# Patient Record
Sex: Female | Born: 1970 | Race: White | Hispanic: No | Marital: Married | Smoking: Never smoker
Health system: Southern US, Community
[De-identification: ages and names within clinical notes are randomized; demographics above are authoritative.]

## PROBLEM LIST (undated history)

## (undated) DIAGNOSIS — J45909 Unspecified asthma, uncomplicated: Secondary | ICD-10-CM

## (undated) DIAGNOSIS — Q6589 Other specified congenital deformities of hip: Secondary | ICD-10-CM

## (undated) DIAGNOSIS — F32A Depression, unspecified: Secondary | ICD-10-CM

## (undated) DIAGNOSIS — F419 Anxiety disorder, unspecified: Secondary | ICD-10-CM

## (undated) DIAGNOSIS — F909 Attention-deficit hyperactivity disorder, unspecified type: Secondary | ICD-10-CM

## (undated) DIAGNOSIS — F329 Major depressive disorder, single episode, unspecified: Secondary | ICD-10-CM

## (undated) DIAGNOSIS — N939 Abnormal uterine and vaginal bleeding, unspecified: Secondary | ICD-10-CM

## (undated) DIAGNOSIS — U071 COVID-19: Secondary | ICD-10-CM

## (undated) DIAGNOSIS — L409 Psoriasis, unspecified: Secondary | ICD-10-CM

## (undated) DIAGNOSIS — N949 Unspecified condition associated with female genital organs and menstrual cycle: Secondary | ICD-10-CM

## (undated) DIAGNOSIS — Z6791 Unspecified blood type, Rh negative: Secondary | ICD-10-CM

## (undated) DIAGNOSIS — O169 Unspecified maternal hypertension, unspecified trimester: Secondary | ICD-10-CM

## (undated) DIAGNOSIS — E785 Hyperlipidemia, unspecified: Secondary | ICD-10-CM

## (undated) DIAGNOSIS — A630 Anogenital (venereal) warts: Secondary | ICD-10-CM

## (undated) HISTORY — PX: TONSILLECTOMY: SUR1361

## (undated) HISTORY — DX: Psoriasis, unspecified: L40.9

## (undated) HISTORY — DX: Other specified congenital deformities of hip: Q65.89

## (undated) HISTORY — DX: Unspecified blood type, rh negative: Z67.91

## (undated) HISTORY — DX: COVID-19: U07.1

## (undated) HISTORY — DX: Unspecified maternal hypertension, unspecified trimester: O16.9

## (undated) HISTORY — DX: Anogenital (venereal) warts: A63.0

---
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
## 1998-03-28 ENCOUNTER — Other Ambulatory Visit: Admission: RE | Admit: 1998-03-28 | Discharge: 1998-03-28 | Payer: Self-pay | Admitting: Obstetrics and Gynecology

## 1998-09-12 ENCOUNTER — Inpatient Hospital Stay (HOSPITAL_COMMUNITY): Admission: AD | Admit: 1998-09-12 | Discharge: 1998-09-12 | Payer: Self-pay | Admitting: Obstetrics and Gynecology

## 1998-11-09 ENCOUNTER — Inpatient Hospital Stay (HOSPITAL_COMMUNITY): Admission: AD | Admit: 1998-11-09 | Discharge: 1998-11-12 | Payer: Self-pay | Admitting: Obstetrics and Gynecology

## 1999-04-24 ENCOUNTER — Other Ambulatory Visit: Admission: RE | Admit: 1999-04-24 | Discharge: 1999-04-24 | Payer: Self-pay | Admitting: Obstetrics and Gynecology

## 2000-07-15 ENCOUNTER — Other Ambulatory Visit: Admission: RE | Admit: 2000-07-15 | Discharge: 2000-07-15 | Payer: Self-pay | Admitting: Obstetrics and Gynecology

## 2001-05-14 ENCOUNTER — Other Ambulatory Visit: Admission: RE | Admit: 2001-05-14 | Discharge: 2001-05-14 | Payer: Self-pay | Admitting: Obstetrics and Gynecology

## 2002-06-01 ENCOUNTER — Other Ambulatory Visit: Admission: RE | Admit: 2002-06-01 | Discharge: 2002-06-01 | Payer: Self-pay | Admitting: Obstetrics and Gynecology

## 2003-03-02 ENCOUNTER — Inpatient Hospital Stay (HOSPITAL_COMMUNITY): Admission: AD | Admit: 2003-03-02 | Discharge: 2003-03-02 | Payer: Self-pay | Admitting: Obstetrics and Gynecology

## 2003-07-08 ENCOUNTER — Inpatient Hospital Stay (HOSPITAL_COMMUNITY): Admission: AD | Admit: 2003-07-08 | Discharge: 2003-07-08 | Payer: Self-pay | Admitting: Obstetrics and Gynecology

## 2003-08-27 ENCOUNTER — Inpatient Hospital Stay (HOSPITAL_COMMUNITY): Admission: AD | Admit: 2003-08-27 | Discharge: 2003-08-28 | Payer: Self-pay | Admitting: Obstetrics and Gynecology

## 2003-09-01 ENCOUNTER — Inpatient Hospital Stay (HOSPITAL_COMMUNITY): Admission: AD | Admit: 2003-09-01 | Discharge: 2003-09-03 | Payer: Self-pay | Admitting: Obstetrics and Gynecology

## 2003-09-04 ENCOUNTER — Inpatient Hospital Stay (HOSPITAL_COMMUNITY): Admission: AD | Admit: 2003-09-04 | Discharge: 2003-09-07 | Payer: Self-pay | Admitting: Obstetrics and Gynecology

## 2003-10-14 ENCOUNTER — Other Ambulatory Visit: Admission: RE | Admit: 2003-10-14 | Discharge: 2003-10-14 | Payer: Self-pay | Admitting: Obstetrics and Gynecology

## 2004-10-16 ENCOUNTER — Other Ambulatory Visit: Admission: RE | Admit: 2004-10-16 | Discharge: 2004-10-16 | Payer: Self-pay | Admitting: Obstetrics and Gynecology

## 2004-10-17 ENCOUNTER — Encounter (INDEPENDENT_AMBULATORY_CARE_PROVIDER_SITE_OTHER): Payer: Self-pay | Admitting: Specialist

## 2004-10-17 ENCOUNTER — Encounter: Admission: RE | Admit: 2004-10-17 | Discharge: 2004-10-17 | Payer: Self-pay | Admitting: Obstetrics and Gynecology

## 2004-10-17 HISTORY — PX: BREAST BIOPSY: SHX20

## 2005-04-02 ENCOUNTER — Encounter: Admission: RE | Admit: 2005-04-02 | Discharge: 2005-04-02 | Payer: Self-pay | Admitting: Obstetrics and Gynecology

## 2005-11-12 ENCOUNTER — Other Ambulatory Visit: Admission: RE | Admit: 2005-11-12 | Discharge: 2005-11-12 | Payer: Self-pay | Admitting: Obstetrics and Gynecology

## 2010-10-13 NOTE — Discharge Summary (Signed)
NAME:  MACKIE, GOON                        ACCOUNT NO.:  [id number]   MEDICAL RECORD NO.:  [id number]                   PATIENT TYPE:  INP   LOCATION:  9306                                 FACILITY:  [facility]   PHYSICIAN:  Osborn Coho, M.D.                DATE OF BIRTH:  [birth year]   DATE OF ADMISSION:  09/04/2003  DATE OF DISCHARGE:  09/07/2003                                 DISCHARGE SUMMARY   ADMITTING DIAGNOSES:  1. Three days status post spontaneous vaginal birth.  2. Febrile illness.  3. Engorgement.  4. Elevated white blood cell count.   DISCHARGE DIAGNOSIS:  Postpartum fever with resolution.   PROCEDURES:  1. Antibiotic therapy.  2. Pelvic ultrasound.   HOSPITAL COURSE:  Ms. Robbs is a 40 year old gravida 2 para 2-0-0-2 at 3  days status post spontaneous vaginal birth who presented on September 04, 2003  with a 24-hour history of fever (maximum of 102.8 on day of admission, 101  the day prior to).  She was also having some significant constipation and  engorgement.  She denied any dysuria, nausea, vomiting, cramping, or heavy  bleeding.  She has had chills but overall felt well except for engorgement.  She had stopped breastfeeding approximately 2 days ago and was using the  usual comfort measures.  She had had an uncomplicated vaginal birth with a  negative beta strep culture.  She had a second degree perineal laceration  and epidural anesthesia.  Her history was remarkable for (1) chronic  psoriasis, (2) decreased platelets with platelets 157 at new OB visit, 102  on labor and delivery admission, 90 on day #1 postpartum with Dr. Pennie Rushing  planning a 6-week followup, (3) history of PIH with her previous  pregnancies.   On admission patient's temperatures were 100.6 and 100.3, pulse was 133,  other vital signs were stable, her chest was clear, she also had significant  bilateral engorgement but no evidence of mastitis, she had positive bowel  sounds and had mild  tenderness in the left lower quadrant, uterus was well  involuted, fundus was normal and firm, negative CVA tenderness, perineum was  within normal limits for second degree healing laceration, there was no  evidence of DVT.   Labs showed a white blood cell count of 15.8 with a shift to the left, UA  showed moderate blood, 15 ketones, and 100 mg of protein (this was sent to  culture).   Patient was admitted for IV antibiotics and continued observation.  By the  morning she had had a bowel movement after Dulcolax, she still had some left  lower quadrant pain but was improved, her temperature max was 103.3 at 2  a.m., blood pressures were stable, fundus was nontender; white blood cell  count was down to 13.8, platelets were 139, hemoglobin was 11.6, neutrophils  were 89, ultrasound was done to rule out retained products, it showed no  significant  findings; she did have a temperature max of 102.3 at 2 a.m. on  September 06, 2003 but that was the last elevation of her temperature she had,  her breasts were remaining full but were less engorged, she was beginning to  feel better.  By postpartum day #6/hospitalization day #3 she had been  afebrile for over 24 hours, breasts were less engorged, her left lower  quadrant abdominal pain was essentially resolved, fundus was firm, perineum  was healing, lochia was scant, she was seen by Dr. Su Hilt and was deemed to  have received full benefit of her hospital stay and was discharged home.   DISCHARGE INSTRUCTIONS:  Patient is to continue her postpartum recovery care  as usual.  She is to notify with any temperature elevations of greater than  100.4 despite usual measures.  Discharge followup will occur in 6 weeks  [facility].     Renaldo Reel Emilee Hero, C.N.M.                   Osborn Coho, M.D.    VLL/MEDQ  D:  09/07/2003  T:  09/07/2003  Job:  [id number]

## 2010-10-13 NOTE — H&P (Signed)
NAME:  Brandi Hill, Brandi Hill                        ACCOUNT NO.:  [id number]   MEDICAL RECORD NO.:  [id number]                   PATIENT TYPE:  INP   LOCATION:  9170                                 FACILITY:  [facility]   PHYSICIAN:  Osborn Coho, M.D.                DATE OF BIRTH:  09-05-1970   DATE OF ADMISSION:  09/01/2003  DATE OF DISCHARGE:                                HISTORY & PHYSICAL   This is a 40 year old gravida 2, para 1-0-0-1, at 38-4/7 weeks, who presents  with uterine contractions every four to five minutes and leaking fluid upon  arrival.  She reports back labor and is very uncomfortable now.  Her  pregnancy has been followed by Dr. Pennie Rushing and remarkable for:   1. Rh negative.  2. History of PIH.  3. Psoriasis.  4. HPV.  5. History of congenital hip dysplasia.  6. Family history of sarcoidosis.  7. 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    ALLERGIES:  None.   PAST OBSTETRICAL HISTORY:  Remarkable for vaginal birth in 2000 of a female  infant at 54 weeks' gestation weighing 6 pounds 1 ounce.  Remarkable for  induction of labor due to pregnancy-induced hypertension.   PAST MEDICAL HISTORY:  Remarkable for pregnancy-induced hypertension in  2000.  Rh negative.  History of HPV.  History of yeast infections.  Childhood varicella.  Psoriasis.  History of congenital hip dysplasia.   FAMILY HISTORY:  Remarkable for MI in her grandfather and uncle,  hypertension in her father, diabetes in her grandmother, colon cancer in her  grandfather, depression in her mother and aunt.   PAST SURGICAL HISTORY:  Remarkable for tonsillectomy at age 75.   GENETIC HISTORY:  Remarkable for patient with congenital hip dysplasia.   SOCIAL HISTORY:  The patient is married to [employer], who is involved  and supportive.  She works as a Social worker.  She does not report a  religious affiliation.  She denies any alcohol, tobacco, or drug use.   OBJECTIVE:  VITAL SIGNS:  Stable, afebrile.  HEENT:   Within normal limits.  NECK:  Thyroid normal, not enlarged.  CHEST:  Clear to auscultation.  CARDIAC:  Regular rate and rhythm.  ABDOMEN:  Gravid at 38 cm, vertex to Leopold's.  EFM shows a reactive fetal  heart rate with uterine contractions every two to four minutes.  PELVIC:  Cervical exam is 4-5 cm, 95% effaced, and 0 station with positive  clear fluid.  EXTREMITIES:  Within normal limits.  SKIN:  Remarkable for scattered lesions of psoriasis throughout.   PRENATAL LABORATORY DATA:  Hemoglobin 13.2, platelets 157.  Blood type O  negative.  Antibody screen negative.  RPR nonreactive.  Rubella immune.  Hepatitis negative.  Pap normal.  Gonorrhea negative, Chlamydia negative.  Cystic fibrosis negative.   ASSESSMENT:  1. Intrauterine pregnancy at 38-4/7 weeks.  2. Active labor.  3. Rupture of membranes.  PLAN:  1. Consulted Dr. Su Hilt, admit to birthing suites.  2. Routine M.D. orders.  3. Nubain and epidural for analgesia.  4. Anticipate SVD.     Marie L. Williams, C.N.M.                 Osborn Coho, M.D.    MLW/MEDQ  D:  09/01/2003  T:  09/01/2003  Job:  [id number]

## 2010-10-13 NOTE — H&P (Signed)
NAME:  Brandi Hill, Brandi Hill                        ACCOUNT NO.:  [id number]   MEDICAL RECORD NO.:  [id number]                   PATIENT TYPE:  INP   LOCATION:  9306                                 [facility]   PHYSICIAN:  Brandi Hill, M.D.              DATE OF BIRTH:  Mar 05, 1971   DATE OF ADMISSION:  09/04/2003  DATE OF DISCHARGE:                                HISTORY & PHYSICAL   BRIEF HISTORY:  Brandi Hill is a 40 year old gravida 2, para 2-0-0-2 at 3  days status post spontaneous vaginal birth with a 24-hour history of fever,  temperature had reached a maximum of 102.8 on the day of admission (it had  been 101 yesterday), she also reported constipation and left lower quadrant  pain.  She has had no bowel movement since prior to delivery.  She denied  dysuria, nausea, vomiting, cramping, or heavy bleeding.  She has had chills  but overall feels well except for engorgement.  She did stop breastfeeding  approximately 2 days ago and has been using the usual comfort measures.  Her  history was remarkable for an uncomplicated vaginal birth, negative beta  strep, secondary perineal laceration and an epidural anesthesia.  Her  medical history has been remarkable for (1) chronic psoriasis, (2) decreased  platelets with platelets of 157 at her new OB, 102 on labor and delivery  admission and 90 on day #1 postpartum, plan per Dr. Pennie Rushing is to repeat at  6 weeks, (3) history of PIH with previous pregnancies.  Labs today showed  CBC of 12.1, hematocrit of 35.4, platelet count of 124, and white blood cell  count of 15.8, differential showed neutrophils of 91, cath UA showed  specific gravity of 1.015, moderate hemoglobin, 15 ketones and 100 mg of  protein, this was sent to culture, blood type otherwise was Rh negative.   ALLERGIES:  None.   PAST OBSTETRICAL HISTORY:  She had a spontaneous vaginal birth on September 02, 2003 with delivery of a viable female.  She had a second degree  perineal  laceration, negative beta strep and an epidural.  Other obstetrical history  includes a vaginal birth in 2000 of a female infant at [redacted] weeks gestation  weighing 6 pounds 1 ounce, she did have induction of labor in that pregnancy  due to pregnancy-induced hypertension.   MEDICAL HISTORY:  Medical history was remarkable for being Rh negative, a  history of HPV, she has a history of yeast infections, childhood Varicella,  chronic psoriasis, and history of congenital hip dysplasia.   FAMILY HISTORY:  Her grandfather had an MI as well as her uncle.  Her father  has hypertension.  Her grandmother has diabetes.  Her grandfather has colon  cancer.  Her mother and aunt have depression.   SURGICAL HISTORY:  Tonsillectomy at age 28.   OTHER HOSPITALIZATIONS:  Childbirth x2.   GENETIC HISTORY:  Patient with congenital  hip dysplasia.   SOCIAL HISTORY:  The patient is married to [place] [employer] who is involved  and supportive.  Patient works as a Scientist, research (medical).  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  She does  not report any religious affiliation.  She denies any alcohol, tobacco, or  drug use.   PHYSICAL EXAMINATION:  Temperature is 100.6 and 100.3, pulse is 133, blood  pressure is 106/64.  Chest is clear.  Heart regular rate and rhythm without  murmur.  Breasts are full and engorged but no evidence of mastitis is noted.  CABG leads are in place.  Abdomen shows positive upper abdominal tympany  with positive bowel sounds noted.  There is no rebound or guarding.  There  is mild tenderness in the left lower quadrant.  Uterus is well involuted and  nontender with no cervical motion tenderness.  Fundus is approximately 4  below the umbilicus, nontender, and firm.  There is negative CVA tenderness  noted.  Extremities are within normal limits, there is no significant edema  and Homan sign is negative.  Perineum shows a healing second degree   laceration, nontender, slightly erythematous but no cellulitis.   ASSESSMENT:  1. Three days status post spontaneous vaginal birth.  2. Febrile illness.  3. Engorgement.  4. Elevated white blood cell count.   PLAN:  1. Admitted to the [facility] third floor per consult     with Dr. Jaymes Graff as attending physician.  2. IV Unasyn 3 g q.6h.  3. Repeat CBC in the morning with differential.  4. If patient does not defervesce by the morning planned ultrasound to rule     out retained products.  5. Engorgement comfort measures.  6. Dulcolax suppository and Colace.  7. MDs will follow.     Brandi Hill, C.N.M.                   Brandi Hill, M.D.    Brandi Hill  D:  09/07/2003  T:  09/07/2003  Job:  [id number]

## 2011-03-01 ENCOUNTER — Other Ambulatory Visit: Payer: Self-pay | Admitting: Obstetrics and Gynecology

## 2011-03-01 DIAGNOSIS — Z1231 Encounter for screening mammogram for malignant neoplasm of breast: Secondary | ICD-10-CM

## 2011-04-23 ENCOUNTER — Ambulatory Visit
Admission: RE | Admit: 2011-04-23 | Discharge: 2011-04-23 | Disposition: A | Discharge status: Home / Self Care | Payer: BC Managed Care – PPO | Source: Ambulatory Visit | Attending: Obstetrics and Gynecology | Admitting: Obstetrics and Gynecology

## 2011-04-23 DIAGNOSIS — Z1231 Encounter for screening mammogram for malignant neoplasm of breast: Secondary | ICD-10-CM

## 2011-05-01 ENCOUNTER — Other Ambulatory Visit: Payer: Self-pay | Admitting: Obstetrics and Gynecology

## 2011-05-01 DIAGNOSIS — R928 Other abnormal and inconclusive findings on diagnostic imaging of breast: Secondary | ICD-10-CM

## 2011-05-11 ENCOUNTER — Ambulatory Visit
Admission: RE | Admit: 2011-05-11 | Discharge: 2011-05-11 | Disposition: A | Discharge status: Home / Self Care | Payer: BC Managed Care – PPO | Source: Ambulatory Visit | Attending: Obstetrics and Gynecology | Admitting: Obstetrics and Gynecology

## 2011-05-11 DIAGNOSIS — R928 Other abnormal and inconclusive findings on diagnostic imaging of breast: Secondary | ICD-10-CM

## 2011-10-29 ENCOUNTER — Encounter: Payer: Self-pay | Admitting: Obstetrics and Gynecology

## 2011-10-31 ENCOUNTER — Ambulatory Visit: Payer: Self-pay | Admitting: Obstetrics and Gynecology

## 2012-04-28 ENCOUNTER — Ambulatory Visit: Payer: Self-pay | Admitting: Obstetrics and Gynecology

## 2012-07-14 ENCOUNTER — Ambulatory Visit: Payer: Self-pay | Admitting: Obstetrics and Gynecology

## 2012-08-11 ENCOUNTER — Other Ambulatory Visit: Payer: Self-pay

## 2012-08-11 DIAGNOSIS — Z1231 Encounter for screening mammogram for malignant neoplasm of breast: Secondary | ICD-10-CM

## 2012-09-08 ENCOUNTER — Ambulatory Visit: Payer: BC Managed Care – PPO

## 2012-11-10 ENCOUNTER — Ambulatory Visit: Payer: BC Managed Care – PPO

## 2012-11-17 ENCOUNTER — Ambulatory Visit
Admission: RE | Admit: 2012-11-17 | Discharge: 2012-11-17 | Disposition: A | Discharge status: Home / Self Care | Payer: BC Managed Care – PPO | Source: Ambulatory Visit

## 2012-11-17 DIAGNOSIS — Z1231 Encounter for screening mammogram for malignant neoplasm of breast: Secondary | ICD-10-CM

## 2012-11-18 ENCOUNTER — Encounter: Payer: Self-pay | Admitting: Obstetrics and Gynecology

## 2012-11-18 DIAGNOSIS — R922 Inconclusive mammogram: Secondary | ICD-10-CM | POA: Insufficient documentation

## 2013-11-23 ENCOUNTER — Other Ambulatory Visit: Payer: Self-pay

## 2013-11-23 DIAGNOSIS — Z1231 Encounter for screening mammogram for malignant neoplasm of breast: Secondary | ICD-10-CM

## 2014-01-25 ENCOUNTER — Ambulatory Visit: Payer: BC Managed Care – PPO

## 2014-02-22 ENCOUNTER — Ambulatory Visit: Payer: BC Managed Care – PPO

## 2014-03-15 ENCOUNTER — Ambulatory Visit: Payer: BC Managed Care – PPO

## 2014-03-29 ENCOUNTER — Encounter: Payer: Self-pay | Admitting: Obstetrics and Gynecology

## 2014-03-29 ENCOUNTER — Ambulatory Visit
Admission: RE | Admit: 2014-03-29 | Discharge: 2014-03-29 | Disposition: A | Discharge status: Home / Self Care | Payer: BC Managed Care – PPO | Source: Ambulatory Visit

## 2014-03-29 DIAGNOSIS — Z1231 Encounter for screening mammogram for malignant neoplasm of breast: Secondary | ICD-10-CM

## 2015-03-30 ENCOUNTER — Other Ambulatory Visit: Payer: Self-pay

## 2015-03-30 DIAGNOSIS — Z1231 Encounter for screening mammogram for malignant neoplasm of breast: Secondary | ICD-10-CM

## 2015-05-02 ENCOUNTER — Ambulatory Visit: Payer: Self-pay

## 2015-06-06 ENCOUNTER — Inpatient Hospital Stay: Admission: RE | Admit: 2015-06-06 | Payer: Self-pay | Source: Ambulatory Visit

## 2015-06-06 ENCOUNTER — Ambulatory Visit: Payer: Self-pay

## 2015-06-08 ENCOUNTER — Other Ambulatory Visit: Payer: Self-pay | Admitting: Obstetrics and Gynecology

## 2015-06-08 ENCOUNTER — Ambulatory Visit
Admission: RE | Admit: 2015-06-08 | Discharge: 2015-06-08 | Disposition: A | Discharge status: Home / Self Care | Payer: BLUE CROSS/BLUE SHIELD | Source: Ambulatory Visit

## 2015-06-08 DIAGNOSIS — N63 Unspecified lump in unspecified breast: Secondary | ICD-10-CM

## 2015-06-08 DIAGNOSIS — Z1231 Encounter for screening mammogram for malignant neoplasm of breast: Secondary | ICD-10-CM

## 2015-06-13 ENCOUNTER — Ambulatory Visit
Admission: RE | Admit: 2015-06-13 | Discharge: 2015-06-13 | Disposition: A | Discharge status: Home / Self Care | Payer: BLUE CROSS/BLUE SHIELD | Source: Ambulatory Visit | Attending: Obstetrics and Gynecology | Admitting: Obstetrics and Gynecology

## 2015-06-13 DIAGNOSIS — N63 Unspecified lump in unspecified breast: Secondary | ICD-10-CM

## 2016-06-27 ENCOUNTER — Other Ambulatory Visit: Payer: Self-pay | Admitting: Obstetrics and Gynecology

## 2016-06-27 DIAGNOSIS — Z1231 Encounter for screening mammogram for malignant neoplasm of breast: Secondary | ICD-10-CM

## 2016-07-30 ENCOUNTER — Ambulatory Visit
Admission: RE | Admit: 2016-07-30 | Discharge: 2016-07-30 | Disposition: A | Discharge status: Home / Self Care | Payer: PRIVATE HEALTH INSURANCE | Source: Ambulatory Visit | Attending: Obstetrics and Gynecology | Admitting: Obstetrics and Gynecology

## 2016-07-30 DIAGNOSIS — Z1231 Encounter for screening mammogram for malignant neoplasm of breast: Secondary | ICD-10-CM

## 2017-08-28 ENCOUNTER — Other Ambulatory Visit: Payer: Self-pay | Admitting: Obstetrics and Gynecology

## 2017-08-28 DIAGNOSIS — Z1231 Encounter for screening mammogram for malignant neoplasm of breast: Secondary | ICD-10-CM

## 2017-09-25 ENCOUNTER — Other Ambulatory Visit: Payer: Self-pay | Admitting: Obstetrics and Gynecology

## 2017-09-30 ENCOUNTER — Ambulatory Visit
Admission: RE | Admit: 2017-09-30 | Discharge: 2017-09-30 | Disposition: A | Discharge status: Home / Self Care | Payer: PRIVATE HEALTH INSURANCE | Source: Ambulatory Visit | Attending: Obstetrics and Gynecology | Admitting: Obstetrics and Gynecology

## 2017-09-30 DIAGNOSIS — Z1231 Encounter for screening mammogram for malignant neoplasm of breast: Secondary | ICD-10-CM

## 2019-09-22 ENCOUNTER — Other Ambulatory Visit: Payer: Self-pay | Admitting: Obstetrics and Gynecology

## 2019-09-22 DIAGNOSIS — R140 Abdominal distension (gaseous): Secondary | ICD-10-CM

## 2019-09-22 DIAGNOSIS — R1909 Other intra-abdominal and pelvic swelling, mass and lump: Secondary | ICD-10-CM

## 2019-09-22 DIAGNOSIS — R971 Elevated cancer antigen 125 [CA 125]: Secondary | ICD-10-CM

## 2019-09-24 ENCOUNTER — Telehealth: Payer: Self-pay | Admitting: *Deleted

## 2019-09-24 NOTE — Telephone Encounter (Signed)
Called and left the patient a message to call the office. Patient needs a new patient appt

## 2019-09-25 ENCOUNTER — Telehealth: Payer: Self-pay | Admitting: *Deleted

## 2019-09-25 NOTE — Telephone Encounter (Signed)
Called and scheduled the patient for a new pt appt with Dr Denman George on 5/7. Gave the patient the address and phone number of the clinic. Patient given the policy for parking, visitors and masks

## 2019-09-28 ENCOUNTER — Ambulatory Visit
Admission: RE | Admit: 2019-09-28 | Discharge: 2019-09-28 | Disposition: A | Discharge status: Home / Self Care | Payer: PRIVATE HEALTH INSURANCE | Source: Ambulatory Visit | Attending: Obstetrics and Gynecology | Admitting: Obstetrics and Gynecology

## 2019-09-28 DIAGNOSIS — R1909 Other intra-abdominal and pelvic swelling, mass and lump: Secondary | ICD-10-CM

## 2019-09-28 DIAGNOSIS — R140 Abdominal distension (gaseous): Secondary | ICD-10-CM

## 2019-09-28 DIAGNOSIS — R971 Elevated cancer antigen 125 [CA 125]: Secondary | ICD-10-CM

## 2019-09-28 MED ORDER — IOPAMIDOL (ISOVUE-300) INJECTION 61%
100.0000 mL | Freq: Once | INTRAVENOUS | Status: AC | PRN
  Administered 2019-09-28: 100 mL via INTRAVENOUS

## 2019-10-02 ENCOUNTER — Inpatient Hospital Stay: Payer: No Typology Code available for payment source | Attending: Gynecologic Oncology | Admitting: Gynecologic Oncology

## 2019-10-02 ENCOUNTER — Other Ambulatory Visit: Payer: Self-pay

## 2019-10-02 ENCOUNTER — Encounter: Payer: Self-pay | Admitting: Gynecologic Oncology

## 2019-10-02 VITALS — BP 145/93 | HR 73 | Temp 99.1°F | Resp 18 | Ht 65.5 in | Wt 153.4 lb

## 2019-10-02 DIAGNOSIS — N83202 Unspecified ovarian cyst, left side: Secondary | ICD-10-CM | POA: Diagnosis not present

## 2019-10-02 DIAGNOSIS — N939 Abnormal uterine and vaginal bleeding, unspecified: Secondary | ICD-10-CM | POA: Insufficient documentation

## 2019-10-02 DIAGNOSIS — R140 Abdominal distension (gaseous): Secondary | ICD-10-CM | POA: Diagnosis not present

## 2019-10-02 DIAGNOSIS — A630 Anogenital (venereal) warts: Secondary | ICD-10-CM | POA: Insufficient documentation

## 2019-10-02 DIAGNOSIS — R971 Elevated cancer antigen 125 [CA 125]: Secondary | ICD-10-CM | POA: Diagnosis not present

## 2019-10-02 DIAGNOSIS — N83201 Unspecified ovarian cyst, right side: Secondary | ICD-10-CM | POA: Diagnosis not present

## 2019-10-02 NOTE — Patient Instructions (Addendum)
Check with your dermatologist regarding continuing on Stelara through surgery.                 Preparing for your Surgery  Plan for surgery on May 20 with Dr.Jayleah Garbers Denman George at [place] will be scheduled for a Robotic assisted Hysterectomy, Unilateral Salpingo-oophorectomy, and Ovarian Cystectomy.  You are scheduled with Joylene John, NP on Oct 08, 2019 @ 1000 for a pre operative visit.  Pre-operative Testing -You will receive a phone call from presurgical testing at [facility] to arrange for a pre-operative testing appointment before your surgery.  This appointment normally occurs one to two weeks before your scheduled surgery.   -Bring your insurance card, copy of an advanced directive if applicable, medication list  -At that visit, you will be asked to sign a consent for a possible blood transfusion in case a transfusion becomes necessary during surgery.  The need for a blood transfusion is rare but having consent is a necessary part of your care.     -You should not be taking blood thinners or aspirin at least ten days prior to surgery unless instructed by your surgeon.  Day Before Surgery at [place] will be asked to take in a light diet the day before surgery.  Avoid carbonated beverages.  You will be advised to have nothing to eat or drink after midnight the evening before.    Eat a light diet the day before surgery.  Examples including soups, broths, toast, yogurt, mashed potatoes.  Things to avoid include carbonated beverages (fizzy beverages), raw fruits and raw vegetables, or beans.   If your bowels are filled with gas, your surgeon will have difficulty visualizing your pelvic organs which increases your surgical risks.  Your role in recovery Your role is to become active as soon as directed by your doctor, while still giving yourself time to heal.  Rest when you feel tired. You will be asked to do the following in order to speed your recovery:  - Cough  and breathe deeply. This helps toclear and expand your lungs and can prevent pneumonia.  - Do mild physical activity. Walking or moving your legs help your circulation and body functions return to normal. A staff member will help you when you try to walk and will provide you with simple exercises. Do not try to get up or walk alone the first time. - Actively manage your pain. Managing your pain lets you move in comfort. We will ask you to rate your pain on a scale of zero to 10. It is your responsibility to tell your doctor or nurse where and how much you hurt so your pain can be treated.  Special Considerations -If you are diabetic, you may be placed on insulin after surgery to have closer control over your blood sugars to promote healing and recovery.  This does not mean that you will be discharged on insulin.  If applicable, your oral antidiabetics will be resumed when you are tolerating a solid diet.  -Your final pathology results from surgery should be available around one week after surgery and the results will be relayed to you when available.  -Dr.Brantley Naser Denman George  is the Surgeon that assists your GYN Oncologist with surgery.  The next day after your surgery you will either see your GYN Oncologist, Dr. Denman George, or Dr. Lahoma Crocker.  -FMLA forms can be faxed to [phone] and please allow 5-7 business days for completion.  Eat a light diet the  day before surgery.  Examples including soups, broths, toast, yogurt, mashed potatoes.  Things to avoid include carbonated beverages (fizzy beverages), raw fruits and raw vegetables, or beans.   If your bowels are filled with gas, your surgeon will have difficulty visualizing your pelvic organs which increases your surgical risks.  Blood Transfusion Information WHAT IS A BLOOD TRANSFUSION? A transfusion is the replacement of blood or some of its parts. Blood is made up of multiple cells which provide different functions.  Red blood cells carry  oxygen and are used for blood loss replacement.  White blood cells fight against infection.  Platelets control bleeding.  Plasma helps clot blood.  Other blood products are available for specialized needs, such as hemophilia or other clotting disorders. BEFORE THE TRANSFUSION  Who gives blood for transfusions?   You may be able to donate blood to be used at a later date on yourself (autologous donation).  Relatives can be asked to donate blood. This is generally not any safer than if you have received blood from a stranger. The same precautions are taken to ensure safety when a relative's blood is donated.  Healthy volunteers who are fully evaluated to make sure their blood is safe. This is blood bank blood. Transfusion therapy is the safest it has ever been in the practice of medicine. Before blood is taken from a donor, a complete history is taken to make sure that person has no history of diseases nor engages in risky social behavior (examples are intravenous drug use or sexual activity with multiple partners). The donor's travel history is screened to minimize risk of transmitting infections, such as malaria. The donated blood is tested for signs of infectious diseases, such as HIV and hepatitis. The blood is then tested to be sure it is compatible with you in order to minimize the chance of a transfusion reaction. If you or a relative donates blood, this is often done in anticipation of surgery and is not appropriate for emergency situations. It takes many days to process the donated blood. RISKS AND COMPLICATIONS Although transfusion therapy is very safe and saves many lives, the main dangers of transfusion include:   Getting an infectious disease.  Developing a transfusion reaction. This is an allergic reaction to something in the blood you were given. Every precaution is taken to prevent this. The decision to have a blood transfusion has been considered carefully by your caregiver  before blood is given. Blood is not given unless the benefits outweigh the risks.

## 2019-10-02 NOTE — Progress Notes (Signed)
Consult Note: Gyn-Onc  Consult was requested by Dr. Helane Rima for the evaluation of Sabri A Eder 49 y.o. female  CC:  Chief Complaint  Patient presents with  . Elevated CA-125    New Patient    Assessment/Plan:  Ms. Marin Vittone Ruacho  is a 49 y.o.  year old with bilateral adnexal cysts and abnormal uterine bleeding.  She has a mildly elevated Ca1 25 at 64.  I reviewed the CT scan images with the patient and her husband.  I discussed that overall I have a lower suspicion of malignancy given the cystic appearance of the mass and its uniform shape.  I explained that Ca1 25 can be associated with false positives particularly premenopausal women with benign adnexal masses.  However given the elevation in Ca1 25, her symptom profile, I recommend surgical resection for definitive diagnosis.  I offered the patient a robotic assisted total hysterectomy with right salpingo-oophorectomy and left ovarian cystectomy.  If the left ovary appears grossly abnormal or concerning for malignancy, we will resect and remove the entire left tube and ovary.  I explained that frozen section will be obtained and if malignancy is identified staging procedure will be performed that we will involve removal of the contralateral ovary if not already done so, and staging with lymph nodes and peritoneal biopsies omentectomy as indicated by the histopathology.  I explained the operative risks associated with this procedure including  bleeding, infection, damage to internal organs (such as bladder,ureters, bowels), blood clot, reoperation and rehospitalization. I explained anticipated postoperative recovery.  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  I think it is safe and reasonable for her to expect to be able to return to work at 2 weeks postop albeit with somewhat diminished intensity of schedule.  Surgery is scheduled for later in May 2021.   HPI: Ms. Jaynia Dewey is a 49 year old P2 who was  seen in consultation at the request of Dr. Helane Rima for evaluation of a bilateral adnexal masses and elevated Ca1 25.  The patient reported irregular sporadic periods and abdominal bloating since February 2021.  Pelvic examination by her gynecologist felt symptoms were consistent with fibroids.  A transvaginal ultrasound scan was performed on September 21, 2019 and confirmed a uterus measuring 8.8 x 3.4 x 5.4 cm with a 1.15 cm endometrial thickness.  The right ovary was enlarged measuring 8.4 x 5.4 x 7.2 cm and the left ovary was enlarged measuring 3.9 x 3.7 x 3.8 cm.  The right ovary contained an 8.8 x 5.51 cm cyst filled with low-level echoes and a 10 mm solid area on the cyst wall.  It was consistent with either a dermoid or endometrioma.  The left ovary contained multiple small simple appearing cysts with no blood flow seen within the cyst.  There was no free fluid seen.  A Ca1 25 was drawn the same day which was elevated at 64.3.  An endometrial biopsy was also obtained on Sep 25, 2017 in the past and revealed proliferative phase endometrium.  Due to the elevation in Ca1 25 a CT scan of the abdomen and pelvis was ordered and performed on Sep 28, 2019.  It revealed a uterus unremarkable in appearance with an ovoid cystic lesion seen in the right adnexa showing mild peripheral wall thickening measuring 8.9 x 6 cm.  A complex cystic lesion containing multiple thin internal septations was seen in the left adnexa which measured 5.4 x 4 cm.  There is  no evidence of peritoneal thickening, nodularity or ascites.  There was several tiny indeterminate right hepatic lobe lesions.  The patient is otherwise fairly healthy.  She has psoriasis and takes Stelara for this.  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  She is not had her Covid vaccine as she is nervous about side effects and risks of the vaccine.  She has had 2 prior vaginal deliveries and no abdominal surgeries.  She lives with her husband.  Current Meds:   Outpatient Encounter Medications as of 10/02/2019  Medication Sig  . amphetamine-dextroamphetamine (ADDERALL) 10 MG tablet Take 10 mg by mouth daily.  Marland Kitchen amphetamine-dextroamphetamine (ADDERALL) 20 MG tablet dextroamphetamine-amphetamine 20 mg tablet  . buPROPion (WELLBUTRIN SR) 150 MG 12 hr tablet Take 150 mg by mouth 2 (two) times daily.  . Multiple Vitamins-Minerals (MULTIVITAMIN ADULT EXTRA C PO) multivitamin  . STELARA 45 MG/0.5ML SOSY injection INJECT 45 MG UNDER THE SKIN EVERY 12 WEEKS  . [DISCONTINUED] FLUoxetine (PROZAC) 10 MG capsule fluoxetine 10 mg capsule  TAKE ONE CAPSULE BY MOUTH EVERY DAY  . [DISCONTINUED] Magnesium 100 MG CAPS magnesium  daily  . [DISCONTINUED] phentermine (ADIPEX-P) 37.5 MG tablet phentermine 37.5 mg tablet  TK 1 T PO QD  . [DISCONTINUED] progesterone (PROMETRIUM) 100 MG capsule progesterone micronized 100 mg capsule   No facility-administered encounter medications on file as of 10/02/2019.    Allergy:  Allergies  Allergen Reactions  . Bee Venom     Social Hx:   Social History   Socioeconomic History  . Marital status: Married    Spouse name: Not on file  . Number of children: Not on file  . Years of education: Not on file  . Highest education level: Not on file  Occupational History  . Not on file  Tobacco Use  . Smoking status: Never Smoker  . Smokeless tobacco: Never Used  Substance and Sexual Activity  . Alcohol use: Yes    Comment: occasionally  . Drug use: No  . Sexual activity: Yes    Birth control/protection: Surgical    Comment: VAS  Other Topics Concern  . Not on file  Social History Narrative  . Not on file   Social Determinants of Health   Financial Resource Strain:   . Difficulty of Paying Living Expenses:   Food Insecurity:   . Worried About Charity fundraiser in the Last Year:   . Arboriculturist in the Last Year:   Transportation Needs:   . Film/video editor (Medical):   Marland Kitchen Lack of Transportation  (Non-Medical):   Physical Activity:   . Days of Exercise per Week:   . Minutes of Exercise per Session:   Stress:   . Feeling of Stress :   Social Connections:   . Frequency of Communication with Friends and Family:   . Frequency of Social Gatherings with Friends and Family:   . Attends Religious Services:   . Active Member of Clubs or Organizations:   . Attends Archivist Meetings:   Marland Kitchen Marital Status:   Intimate Partner Violence:   . Fear of Current or Ex-Partner:   . Emotionally Abused:   Marland Kitchen Physically Abused:   . Sexually Abused:     Past Surgical Hx:  Past Surgical History:  Procedure Laterality Date  . BREAST BIOPSY  10/17/2004  . TONSILLECTOMY     AGE 31    Past Medical Hx:  Past Medical History:  Diagnosis Date  . Blood  type, Rh negative   . Congenital dysplasia of hip   . HPV (human papilloma virus) anogenital infection    HISTORY OF HPV   . Psoriasis     Past Gynecological History:  See HPi No LMP recorded.  Family Hx:  Family History  Problem Relation Age of Onset  . Depression Mother   . Hypertension Father   . Diabetes Maternal Grandmother   . Cancer Maternal Grandfather        COLON CANCER  . Hypertension Maternal Grandfather   . Psoriasis Paternal Grandmother     Review of Systems:  Constitutional  Feels well,    ENT Normal appearing ears and nares bilaterally Skin/Breast  No rash, sores, jaundice, itching, dryness Cardiovascular  No chest pain, shortness of breath, or edema  Pulmonary  No cough or wheeze.  Gastro Intestinal  No nausea, vomitting, or diarrhoea. No bright red blood per rectum, no abdominal pain, change in bowel movement, or constipation.  Genito Urinary  No frequency, urgency, dysuria, see HPI Musculo Skeletal  No myalgia, arthralgia, joint swelling or pain  Neurologic  No weakness, numbness, change in gait,  Psychology  No depression, anxiety, insomnia.   Vitals:  Blood pressure (!) 145/93, pulse 73,  temperature 99.1 F (37.3 C), temperature source Temporal, resp. rate 18, height 5' 5.5" (1.664 m), weight 153 lb 6.4 oz (69.6 kg), SpO2 100 %.  Physical Exam: WD in NAD Neck  Supple NROM, without any enlargements.  Lymph Node Survey No cervical supraclavicular or inguinal adenopathy Cardiovascular  Pulse normal rate, regularity and rhythm. S1 and S2 normal.  Lungs  Clear to auscultation bilateraly, without wheezes/crackles/rhonchi. Good air movement.  Skin  No rash/lesions/breakdown  Psychiatry  Alert and oriented to person, place, and time  Abdomen  Normoactive bowel sounds, abdomen soft, non-tender and nonobese without evidence of hernia.  Back No CVA tenderness Genito Urinary  Vulva/vagina: Normal external female genitalia.  No lesions. No discharge or bleeding.  Bladder/urethra:  No lesions or masses, well supported bladder  Vagina: smooth, normal, no visible or palpable lesions.  Cervix: Unable to visualize - tilted posteriorally  Uterus: bulky, mobile, no parametrial involvement or nodularity.  Adnexa: fullness in the right adnexa and anteriorally, displacing uterus masses. Rectal  deferred Extremities  No bilateral cyanosis, clubbing or edema.   Thereasa Solo, MD  10/02/2019, 5:02 PM

## 2019-10-02 NOTE — H&P (View-Only) (Signed)
Consult Note: Gyn-Onc  Consult was requested by Dr. Helane Rima for the evaluation of Brandi Hill 49 y.o. female  CC:  Chief Complaint  Patient presents with  . Elevated CA-125    New Patient    Assessment/Plan:  Brandi Hill  is a 49 y.o.  year old with bilateral adnexal cysts and abnormal uterine bleeding.  She has a mildly elevated Ca1 25 at 64.  I reviewed the CT scan images with the patient and her husband.  I discussed that overall I have a lower suspicion of malignancy given the cystic appearance of the mass and its uniform shape.  I explained that Ca1 25 can be associated with false positives particularly premenopausal women with benign adnexal masses.  However given the elevation in Ca1 25, her symptom profile, I recommend surgical resection for definitive diagnosis.  I offered the patient a robotic assisted total hysterectomy with right salpingo-oophorectomy and left ovarian cystectomy.  If the left ovary appears grossly abnormal or concerning for malignancy, we will resect and remove the entire left tube and ovary.  I explained that frozen section will be obtained and if malignancy is identified staging procedure will be performed that we will involve removal of the contralateral ovary if not already done so, and staging with lymph nodes and peritoneal biopsies omentectomy as indicated by the histopathology.  I explained the operative risks associated with this procedure including  bleeding, infection, damage to internal organs (such as bladder,ureters, bowels), blood clot, reoperation and rehospitalization. I explained anticipated postoperative recovery.  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  I think it is safe and reasonable for her to expect to be able to return to work at 2 weeks postop albeit with somewhat diminished intensity of schedule.  Surgery is scheduled for later in May 2021.   HPI: Brandi Hill is a 49 year old P2 who was  seen in consultation at the request of Dr. Helane Rima for evaluation of a bilateral adnexal masses and elevated Ca1 25.  The patient reported irregular sporadic periods and abdominal bloating since February 2021.  Pelvic examination by her gynecologist felt symptoms were consistent with fibroids.  A transvaginal ultrasound scan was performed on September 21, 2019 and confirmed a uterus measuring 8.8 x 3.4 x 5.4 cm with a 1.15 cm endometrial thickness.  The right ovary was enlarged measuring 8.4 x 5.4 x 7.2 cm and the left ovary was enlarged measuring 3.9 x 3.7 x 3.8 cm.  The right ovary contained an 8.8 x 5.51 cm cyst filled with low-level echoes and a 10 mm solid area on the cyst wall.  It was consistent with either a dermoid or endometrioma.  The left ovary contained multiple small simple appearing cysts with no blood flow seen within the cyst.  There was no free fluid seen.  A Ca1 25 was drawn the same day which was elevated at 64.3.  An endometrial biopsy was also obtained on Sep 25, 2017 in the past and revealed proliferative phase endometrium.  Due to the elevation in Ca1 25 a CT scan of the abdomen and pelvis was ordered and performed on Sep 28, 2019.  It revealed a uterus unremarkable in appearance with an ovoid cystic lesion seen in the right adnexa showing mild peripheral wall thickening measuring 8.9 x 6 cm.  A complex cystic lesion containing multiple thin internal septations was seen in the left adnexa which measured 5.4 x 4 cm.  There is  no evidence of peritoneal thickening, nodularity or ascites.  There was several tiny indeterminate right hepatic lobe lesions.  The patient is otherwise fairly healthy.  She has psoriasis and takes Stelara for this.  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  She is not had her Covid vaccine as she is nervous about side effects and risks of the vaccine.  She has had 2 prior vaginal deliveries and no abdominal surgeries.  She lives with her husband.  Current Meds:   Outpatient Encounter Medications as of 10/02/2019  Medication Sig  . amphetamine-dextroamphetamine (ADDERALL) 10 MG tablet Take 10 mg by mouth daily.  Marland Kitchen amphetamine-dextroamphetamine (ADDERALL) 20 MG tablet dextroamphetamine-amphetamine 20 mg tablet  . buPROPion (WELLBUTRIN SR) 150 MG 12 hr tablet Take 150 mg by mouth 2 (two) times daily.  . Multiple Vitamins-Minerals (MULTIVITAMIN ADULT EXTRA C PO) multivitamin  . STELARA 45 MG/0.5ML SOSY injection INJECT 45 MG UNDER THE SKIN EVERY 12 WEEKS  . [DISCONTINUED] FLUoxetine (PROZAC) 10 MG capsule fluoxetine 10 mg capsule  TAKE ONE CAPSULE BY MOUTH EVERY DAY  . [DISCONTINUED] Magnesium 100 MG CAPS magnesium  daily  . [DISCONTINUED] phentermine (ADIPEX-P) 37.5 MG tablet phentermine 37.5 mg tablet  TK 1 T PO QD  . [DISCONTINUED] progesterone (PROMETRIUM) 100 MG capsule progesterone micronized 100 mg capsule   No facility-administered encounter medications on file as of 10/02/2019.    Allergy:  Allergies  Allergen Reactions  . Bee Venom     Social Hx:   Social History   Socioeconomic History  . Marital status: Married    Spouse name: Not on file  . Number of children: Not on file  . Years of education: Not on file  . Highest education level: Not on file  Occupational History  . Not on file  Tobacco Use  . Smoking status: Never Smoker  . Smokeless tobacco: Never Used  Substance and Sexual Activity  . Alcohol use: Yes    Comment: occasionally  . Drug use: No  . Sexual activity: Yes    Birth control/protection: Surgical    Comment: VAS  Other Topics Concern  . Not on file  Social History Narrative  . Not on file   Social Determinants of Health   Financial Resource Strain:   . Difficulty of Paying Living Expenses:   Food Insecurity:   . Worried About Charity fundraiser in the Last Year:   . Arboriculturist in the Last Year:   Transportation Needs:   . Film/video editor (Medical):   Marland Kitchen Lack of Transportation  (Non-Medical):   Physical Activity:   . Days of Exercise per Week:   . Minutes of Exercise per Session:   Stress:   . Feeling of Stress :   Social Connections:   . Frequency of Communication with Friends and Family:   . Frequency of Social Gatherings with Friends and Family:   . Attends Religious Services:   . Active Member of Clubs or Organizations:   . Attends Archivist Meetings:   Marland Kitchen Marital Status:   Intimate Partner Violence:   . Fear of Current or Ex-Partner:   . Emotionally Abused:   Marland Kitchen Physically Abused:   . Sexually Abused:     Past Surgical Hx:  Past Surgical History:  Procedure Laterality Date  . BREAST BIOPSY  10/17/2004  . TONSILLECTOMY     AGE 50    Past Medical Hx:  Past Medical History:  Diagnosis Date  . Blood  type, Rh negative   . Congenital dysplasia of hip   . HPV (human papilloma virus) anogenital infection    HISTORY OF HPV   . Psoriasis     Past Gynecological History:  See HPi No LMP recorded.  Family Hx:  Family History  Problem Relation Age of Onset  . Depression Mother   . Hypertension Father   . Diabetes Maternal Grandmother   . Cancer Maternal Grandfather        COLON CANCER  . Hypertension Maternal Grandfather   . Psoriasis Paternal Grandmother     Review of Systems:  Constitutional  Feels well,    ENT Normal appearing ears and nares bilaterally Skin/Breast  No rash, sores, jaundice, itching, dryness Cardiovascular  No chest pain, shortness of breath, or edema  Pulmonary  No cough or wheeze.  Gastro Intestinal  No nausea, vomitting, or diarrhoea. No bright red blood per rectum, no abdominal pain, change in bowel movement, or constipation.  Genito Urinary  No frequency, urgency, dysuria, see HPI Musculo Skeletal  No myalgia, arthralgia, joint swelling or pain  Neurologic  No weakness, numbness, change in gait,  Psychology  No depression, anxiety, insomnia.   Vitals:  Blood pressure (!) 145/93, pulse 73,  temperature 99.1 F (37.3 C), temperature source Temporal, resp. rate 18, height 5' 5.5" (1.664 m), weight 153 lb 6.4 oz (69.6 kg), SpO2 100 %.  Physical Exam: WD in NAD Neck  Supple NROM, without any enlargements.  Lymph Node Survey No cervical supraclavicular or inguinal adenopathy Cardiovascular  Pulse normal rate, regularity and rhythm. S1 and S2 normal.  Lungs  Clear to auscultation bilateraly, without wheezes/crackles/rhonchi. Good air movement.  Skin  No rash/lesions/breakdown  Psychiatry  Alert and oriented to person, place, and time  Abdomen  Normoactive bowel sounds, abdomen soft, non-tender and nonobese without evidence of hernia.  Back No CVA tenderness Genito Urinary  Vulva/vagina: Normal external female genitalia.  No lesions. No discharge or bleeding.  Bladder/urethra:  No lesions or masses, well supported bladder  Vagina: smooth, normal, no visible or palpable lesions.  Cervix: Unable to visualize - tilted posteriorally  Uterus: bulky, mobile, no parametrial involvement or nodularity.  Adnexa: fullness in the right adnexa and anteriorally, displacing uterus masses. Rectal  deferred Extremities  No bilateral cyanosis, clubbing or edema.   Thereasa Solo, MD  10/02/2019, 5:02 PM

## 2019-10-05 ENCOUNTER — Telehealth: Payer: Self-pay | Admitting: *Deleted

## 2019-10-05 NOTE — Telephone Encounter (Signed)
Fax office note and office note to [facility] dermatology

## 2019-10-06 ENCOUNTER — Other Ambulatory Visit: Payer: Self-pay | Admitting: Gynecologic Oncology

## 2019-10-06 DIAGNOSIS — N939 Abnormal uterine and vaginal bleeding, unspecified: Secondary | ICD-10-CM

## 2019-10-06 DIAGNOSIS — N83201 Unspecified ovarian cyst, right side: Secondary | ICD-10-CM

## 2019-10-07 ENCOUNTER — Encounter (HOSPITAL_COMMUNITY): Payer: Self-pay

## 2019-10-07 ENCOUNTER — Telehealth: Payer: Self-pay | Admitting: *Deleted

## 2019-10-07 NOTE — Patient Instructions (Addendum)
DUE TO COVID-19 ONLY ONE VISITOR ARE ALLOWED TO COME WITH YOU AND STAY IN THE WAITING ROOM ONLY DURING PRE OP AND PROCEDURE. THEN TWO VISITORS MAY VISIT WITH YOU IN YOUR PRIVATE ROOM DURING VISITING HOURS ONLY!!   COVID SWAB TESTING MUST BE COMPLETED ON:   Monday, Oct 12, 2019 at 9:35 AM  [address], [place] -Former [facility] enter pre surgical testing line (Must self quarantine after testing. Follow instructions on handout.)             Your procedure is scheduled on: Thursday, Oct 15, 2019   Report to [facility] Main  Entrance    Report to admitting at 8:00 AM   Call this number if you have problems the morning of surgery [phone]   Do not eat food:After Midnight.   May have liquids until 7:00 AM day of surgery   CLEAR LIQUID DIET  Foods Allowed                                                                     Foods Excluded  Water, Black Coffee and tea, regular and decaf                             liquids that you cannot  Plain Jell-O in any flavor  (No red)                                           see through such as: Fruit ices (not with fruit pulp)                                     milk, soups, orange juice  Iced Popsicles (No red)                                    All solid food Carbonated beverages, regular and diet                                    Apple juices Sports drinks like Gatorade (No red) Lightly seasoned clear broth or consume(fat free) Sugar, honey syrup  Sample Menu Breakfast                                Lunch                                     Supper Cranberry juice                    Beef broth                            Chicken broth Jell-O  Grape juice                           Apple juice Coffee or tea                        Jell-O                                      Popsicle                                                Coffee or tea                        Coffee or  tea   Oral Hygiene is also important to reduce your risk of infection.                                    Remember - BRUSH YOUR TEETH THE MORNING OF SURGERY WITH YOUR REGULAR TOOTHPASTE   Do NOT smoke after Midnight   Take these medicines the morning of surgery with A SIP OF WATER: Bupropion, Ativan if needed                               You may not have any metal on your body including hair pins, jewelry, and body piercings             Do not wear make-up, lotions, powders, perfumes/cologne, or deodorant             Do not wear nail polish.  Do not shave  48 hours prior to surgery.                Do not bring valuables to the hospital. [place].   Contacts, dentures or bridgework may not be worn into surgery.    Patients discharged the day of surgery will not be allowed to drive home.   Special Instructions: Bring a copy of your healthcare power of attorney and living will documents         the day of surgery if you haven't scanned them in before.              Please read over the following fact sheets you were given: IF YOU HAVE QUESTIONS ABOUT YOUR PRE OP INSTRUCTIONS PLEASE CALL [phone]   [place] - Preparing for Surgery Before surgery, you can play an important role.  Because skin is not sterile, your skin needs to be as free of germs as possible.  You can reduce the number of germs on your skin by washing with CHG (chlorahexidine gluconate) soap before surgery.  CHG is an antiseptic cleaner which kills germs and bonds with the skin to continue killing germs even after washing. Please DO NOT use if you have an allergy to CHG or antibacterial soaps.  If your skin becomes reddened/irritated stop using the CHG and inform your nurse when you arrive at  Short Stay. Do not shave (including legs and underarms) for at least 48 hours prior to the first CHG shower.  You may shave your face/neck.  Please follow these instructions  carefully:  1.  Shower with CHG Soap the night before surgery and the  morning of surgery.  2.  If you choose to wash your hair, wash your hair first as usual with your normal  shampoo.  3.  After you shampoo, rinse your hair and body thoroughly to remove the shampoo.                             4.  Use CHG as you would any other liquid soap.  You can apply chg directly to the skin and wash.  Gently with a scrungie or clean washcloth.  5.  Apply the CHG Soap to your body ONLY FROM THE NECK DOWN.   Do   not use on face/ open                           Wound or open sores. Avoid contact with eyes, ears mouth and   genitals (private parts).                       Wash face,  Genitals (private parts) with your normal soap.             6.  Wash thoroughly, paying special attention to the area where your    surgery  will be performed.  7.  Thoroughly rinse your body with warm water from the neck down.  8.  DO NOT shower/wash with your normal soap after using and rinsing off the CHG Soap.                9.  Pat yourself dry with a clean towel.            10.  Wear clean pajamas.            11.  Place clean sheets on your bed the night of your first shower and do not  sleep with pets. Day of Surgery : Do not apply any lotions/deodorants the morning of surgery.  Please wear clean clothes to the hospital/surgery center.  FAILURE TO FOLLOW THESE INSTRUCTIONS MAY RESULT IN THE CANCELLATION OF YOUR SURGERY  PATIENT SIGNATURE_________________________________  NURSE SIGNATURE__________________________________  ________________________________________________________________________  WHAT IS A BLOOD TRANSFUSION? Blood Transfusion Information  A transfusion is the replacement of blood or some of its parts. Blood is made up of multiple cells which provide different functions.  Red blood cells carry oxygen and are used for blood loss replacement.  White blood cells fight against infection.  Platelets  control bleeding.  Plasma helps clot blood.  Other blood products are available for specialized needs, such as hemophilia or other clotting disorders. BEFORE THE TRANSFUSION  Who gives blood for transfusions?   Healthy volunteers who are fully evaluated to make sure their blood is safe. This is blood bank blood. Transfusion therapy is the safest it has ever been in the practice of medicine. Before blood is taken from a donor, a complete history is taken to make sure that person has no history of diseases nor engages in risky social behavior (examples are intravenous drug use or sexual activity with multiple partners). The donor's travel history is screened to minimize risk of transmitting infections, such as malaria.  The donated blood is tested for signs of infectious diseases, such as HIV and hepatitis. The blood is then tested to be sure it is compatible with you in order to minimize the chance of a transfusion reaction. If you or a relative donates blood, this is often done in anticipation of surgery and is not appropriate for emergency situations. It takes many days to process the donated blood. RISKS AND COMPLICATIONS Although transfusion therapy is very safe and saves many lives, the main dangers of transfusion include:   Getting an infectious disease.  Developing a transfusion reaction. This is an allergic reaction to something in the blood you were given. Every precaution is taken to prevent this. The decision to have a blood transfusion has been considered carefully by your caregiver before blood is given. Blood is not given unless the benefits outweigh the risks. AFTER THE TRANSFUSION  Right after receiving a blood transfusion, you will usually feel much better and more energetic. This is especially true if your red blood cells have gotten low (anemic). The transfusion raises the level of the red blood cells which carry oxygen, and this usually causes an energy increase.  The nurse  administering the transfusion will monitor you carefully for complications. HOME CARE INSTRUCTIONS  No special instructions are needed after a transfusion. You may find your energy is better. Speak with your caregiver about any limitations on activity for underlying diseases you may have. SEEK MEDICAL CARE IF:   Your condition is not improving after your transfusion.  You develop redness or irritation at the intravenous (IV) site. SEEK IMMEDIATE MEDICAL CARE IF:  Any of the following symptoms occur over the next 12 hours:  Shaking chills.  You have a temperature by mouth above 102 F (38.9 C), not controlled by medicine.  Chest, back, or muscle pain.  People around you feel you are not acting correctly or are confused.  Shortness of breath or difficulty breathing.  Dizziness and fainting.  You get a rash or develop hives.  You have a decrease in urine output.  Your urine turns a dark color or changes to pink, red, or brown. Any of the following symptoms occur over the next 10 days:  You have a temperature by mouth above 102 F (38.9 C), not controlled by medicine.  Shortness of breath.  Weakness after normal activity.  The white part of the eye turns yellow (jaundice).  You have a decrease in the amount of urine or are urinating less often.  Your urine turns a dark color or changes to pink, red, or brown. Document Released: 05/11/2000 Document Revised: 08/06/2011 Document Reviewed: 12/29/2007 [facility] Patient Information 2014 [place], [place].  _______________________________________________________________________

## 2019-10-07 NOTE — Telephone Encounter (Signed)
Fax surgical clearance form and office note to Dr Ubaldo Glassing

## 2019-10-07 NOTE — Patient Instructions (Signed)
Preparing for your Surgery  Plan for surgery on Oct 15, 2019 with Dr. Everitt Amber at [facility] will be scheduled for a robotic assisted total hysterectomy with right salpingo-oophorectomy and left ovarian cystectomy.   Pre-operative Testing -You will receive a phone call from presurgical testing at [facility] to arrange for a pre-operative appointment over the phone, lab appointment, and COVID test. The COVID test normally happens 3 days prior to the surgery and they ask that you self quarantine after the test up until surgery to decrease chance of exposure.  -Bring your insurance card, copy of an advanced directive if applicable, medication list  -At that visit, you will be asked to sign a consent for a possible blood transfusion in case a transfusion becomes necessary during surgery.  The need for a blood transfusion is rare but having consent is a necessary part of your care.     -You should not be taking blood thinners or aspirin at least ten days prior to surgery unless instructed by your surgeon.  -Do not take supplements such as fish oil (omega 3), red yeast rice, turmeric before your surgery.   Day Before Surgery at [facility] will be asked to take in a light diet the day before surgery.  Avoid carbonated beverages.  You will be advised you can have clear liquids after midnight and up until 3 hours before your surgery.    Eat a light diet the day before surgery.  Examples including soups, broths, toast, yogurt, mashed potatoes.  Things to avoid include carbonated beverages (fizzy beverages), raw fruits and raw vegetables, or beans.   If your bowels are filled with gas, your surgeon will have difficulty visualizing your pelvic organs which increases your surgical risks.  Your role in recovery Your role is to become active as soon as directed by your doctor, while still giving yourself time to heal.  Rest when you feel tired. You will be asked to do the following  in order to speed your recovery:  - Cough and breathe deeply. This helps to clear and expand your lungs and can prevent pneumonia after surgery.  - [place]. Do mild physical activity. Walking or moving your legs help your circulation and body functions return to normal. Do not try to get up or walk alone the first time after surgery.   -If you develop swelling on one leg or the other, pain in the back of your leg, redness/warmth in one of your legs, please call the office or go to the Emergency Room to have a doppler to rule out a blood clot. For shortness of breath, chest pain-seek care in the Emergency Room as soon as possible. - Actively manage your pain. Managing your pain lets you move in comfort. We will ask you to rate your pain on a scale of zero to 10. It is your responsibility to tell your doctor or nurse where and how much you hurt so your pain can be treated.  Special Considerations -If you are diabetic, you may be placed on insulin after surgery to have closer control over your blood sugars to promote healing and recovery.  This does not mean that you will be discharged on insulin.  If applicable, your oral antidiabetics will be resumed when you are tolerating a solid diet.  -Your final pathology results from surgery should be available around one week after surgery and the results will be relayed to you when available.  -  Dr. Lahoma Crocker is the surgeon that assists your GYN Oncologist with surgery.  If you end up staying the night, the next day after your surgery you will either see Dr. Denman George, Dr. Berline Lopes, or Dr. Lahoma Crocker.  -FMLA forms can be faxed to [phone] and please allow 5-7 business days for completion.  Pain Management After Surgery -You have been prescribed your pain medication and bowel regimen medications before surgery so that you can have these available when you are discharged from the hospital. The pain medication is for use ONLY  AFTER surgery and a new prescription will not be given.   -Make sure that you have Tylenol and Ibuprofen at home to use on a regular basis after surgery for pain control. We recommend alternating the medications every hour to six hours since they work differently and are processed in the body differently for pain relief.  -Review the attached handout on narcotic use and their risks and side effects.   Bowel Regimen -You have been prescribed Sennakot-S to take nightly to prevent constipation especially if you are taking the narcotic pain medication intermittently.  It is important to prevent constipation and drink adequate amounts of liquids. You can stop taking this medication when you are not taking pain medication and you are back on your normal bowel routine.  Risks of Surgery Risks of surgery are low but include bleeding, infection, damage to surrounding structures, re-operation, blood clots, and very rarely death.   Blood Transfusion Information (For the consent to be signed before surgery)  We will be checking your blood type before surgery so in case of emergencies, we will know what type of blood you would need.                                            WHAT IS A BLOOD TRANSFUSION?  A transfusion is the replacement of blood or some of its parts. Blood is made up of multiple cells which provide different functions.  Red blood cells carry oxygen and are used for blood loss replacement.  White blood cells fight against infection.  Platelets control bleeding.  Plasma helps clot blood.  Other blood products are available for specialized needs, such as hemophilia or other clotting disorders. BEFORE THE TRANSFUSION  Who gives blood for transfusions?   You may be able to donate blood to be used at a later date on yourself (autologous donation).  Relatives can be asked to donate blood. This is generally not any safer than if you have received blood from a stranger. The same  precautions are taken to ensure safety when a relative's blood is donated.  Healthy volunteers who are fully evaluated to make sure their blood is safe. This is blood bank blood. Transfusion therapy is the safest it has ever been in the practice of medicine. Before blood is taken from a donor, a complete history is taken to make sure that person has no history of diseases nor engages in risky social behavior (examples are intravenous drug use or sexual activity with multiple partners). The donor's travel history is screened to minimize risk of transmitting infections, such as malaria. The donated blood is tested for signs of infectious diseases, such as HIV and hepatitis. The blood is then tested to be sure it is compatible with you in order to minimize the chance of a transfusion reaction. If you or  a relative donates blood, this is often done in anticipation of surgery and is not appropriate for emergency situations. It takes many days to process the donated blood. RISKS AND COMPLICATIONS Although transfusion therapy is very safe and saves many lives, the main dangers of transfusion include:   Getting an infectious disease.  Developing a transfusion reaction. This is an allergic reaction to something in the blood you were given. Every precaution is taken to prevent this. The decision to have a blood transfusion has been considered carefully by your caregiver before blood is given. Blood is not given unless the benefits outweigh the risks.  AFTER SURGERY INSTRUCTIONS  Return to work: 4-6 weeks if applicable  Activity: 1. Be up and out of the bed during the day.  Take a nap if needed.  You may walk up steps but be careful and use the hand rail.  Stair climbing will tire you more than you think, you may need to stop part way and rest.   2. No lifting or straining for 6 weeks over 10 pounds. No pushing, pulling, straining for 6 weeks.  3. No driving for 1 week(s).  Do not drive if you are taking  narcotic pain medicine and make sure that your reaction time has returned.   4. You can shower as soon as the next day after surgery. Shower daily.  Use soap and water on your incision and pat dry; don't rub.  No tub baths or submerging your body in water until cleared by your surgeon. If you have the soap that was given to you by pre-surgical testing that was used before surgery, you do not need to use it afterwards because this can irritate your incisions.   5. No sexual activity and nothing in the vagina for 8 weeks.  6. You may experience a small amount of clear drainage from your incisions, which is normal.  If the drainage persists, increases, or changes color please call the office.  7. Do not use creams, lotions, or ointments such as neosporin on your incisions after surgery until advised by your surgeon because they can cause removal of the dermabond glue on your incisions.    8. You may experience vaginal spotting after surgery or around the 6-8 week mark from surgery when the stitches at the top of the vagina begin to dissolve.  The spotting is normal but if you experience heavy bleeding, call our office.  9. Take Tylenol or ibuprofen first for pain and only use narcotic pain medication for severe pain not relieved by the Tylenol or Ibuprofen.  Monitor your Tylenol intake to a max of 4,000 mg.  Diet: 1. Low sodium Heart Healthy Diet is recommended.  2. It is safe to use a laxative, such as Miralax or Colace, if you have difficulty moving your bowels. You can take Sennakot at bedtime every evening to keep bowel movements regular and to prevent constipation.    Wound Care: 1. Keep clean and dry.  Shower daily.  Reasons to call the Doctor:  Fever - Oral temperature greater than 100.4 degrees Fahrenheit  Foul-smelling vaginal discharge  Difficulty urinating  Nausea and vomiting  Increased pain at the site of the incision that is unrelieved with pain medicine.  Difficulty  breathing with or without chest pain  New calf pain especially if only on one side  Sudden, continuing increased vaginal bleeding with or without clots.   Contacts: For questions or concerns you should contact:  Dr. Everitt Amber at  [place], NP at [phone]  After Hours: call [phone] and have the GYN Oncologist paged/contacted

## 2019-10-08 ENCOUNTER — Other Ambulatory Visit: Payer: Self-pay

## 2019-10-08 ENCOUNTER — Inpatient Hospital Stay (HOSPITAL_BASED_OUTPATIENT_CLINIC_OR_DEPARTMENT_OTHER): Payer: No Typology Code available for payment source | Admitting: Gynecologic Oncology

## 2019-10-08 VITALS — BP 123/81 | HR 77 | Temp 98.5°F | Resp 18 | Ht 65.5 in | Wt 155.5 lb

## 2019-10-08 DIAGNOSIS — R971 Elevated cancer antigen 125 [CA 125]: Secondary | ICD-10-CM

## 2019-10-08 DIAGNOSIS — N83201 Unspecified ovarian cyst, right side: Secondary | ICD-10-CM

## 2019-10-08 DIAGNOSIS — N83202 Unspecified ovarian cyst, left side: Secondary | ICD-10-CM

## 2019-10-08 DIAGNOSIS — N939 Abnormal uterine and vaginal bleeding, unspecified: Secondary | ICD-10-CM

## 2019-10-08 MED ORDER — OXYCODONE HCL 5 MG PO TABS: 5.0000 mg | ORAL_TABLET | ORAL | 0 refills | Status: DC | PRN

## 2019-10-08 MED ORDER — SENNOSIDES-DOCUSATE SODIUM 8.6-50 MG PO TABS: 2.0000 | ORAL_TABLET | Freq: Every day | ORAL | 0 refills | Status: DC

## 2019-10-08 MED ORDER — IBUPROFEN 800 MG PO TABS: 800.0000 mg | ORAL_TABLET | Freq: Three times a day (TID) | ORAL | 0 refills | Status: DC | PRN

## 2019-10-08 NOTE — Progress Notes (Signed)
Patient here with her daughter for a pre-operative appointment prior to her scheduled surgery on Oct 15, 2019. She is scheduled for robotic assisted total laparoscopic hysterectomy, right salpingo-oophorectomy, left ovarian cystectomy.  She has her pre-admission testing appointments arranged.  The surgery was discussed in detail.  See after visit summary for additional details. Visual aids used to discuss items related to surgery including the incentive spirometer, sequential compression stockings, foley catheter, IV pump, multi-modal pain regimen including tylenol, photo of the surgical robot, female reproductive system to discuss surgery in detail.      Discussed post-op pain management in detail including the aspects of the enhanced recovery pathway.  Advised her that a new prescription would be sent in for oxycodone and it is only to be used for after her upcoming surgery.  We discussed the use of tylenol post-op and to monitor for a maximum of 4,000 mg in a 24 hour period along with the use of ibuprofen  Also prescribed sennakot to be used after surgery and to hold if having loose stools.  Discussed bowel regimen in detail.     Discussed the use of SCDs, and measures to take at home to prevent DVT including frequent mobility.  Reportable signs and symptoms of DVT discussed. Post-operative instructions discussed and expectations for after surgery. Incisional care discussed as well including reportable signs and symptoms including erythema, drainage.    10 minutes spent with the patient.  Verbalizing understanding of material discussed. No needs or concerns voiced at the end of the visit.  Advised patient and family to call for any needs.  Advised that her post-operative medications had been prescribed and could be picked up at any time.

## 2019-10-12 ENCOUNTER — Encounter (HOSPITAL_COMMUNITY)
Admission: RE | Admit: 2019-10-12 | Discharge: 2019-10-12 | Disposition: A | Discharge status: Home / Self Care | Payer: No Typology Code available for payment source | Source: Ambulatory Visit | Attending: Gynecologic Oncology | Admitting: Gynecologic Oncology

## 2019-10-12 ENCOUNTER — Encounter (HOSPITAL_COMMUNITY): Payer: Self-pay

## 2019-10-12 ENCOUNTER — Other Ambulatory Visit (HOSPITAL_COMMUNITY)
Admission: RE | Admit: 2019-10-12 | Discharge: 2019-10-12 | Disposition: A | Discharge status: Home / Self Care | Payer: No Typology Code available for payment source | Source: Ambulatory Visit | Attending: Gynecologic Oncology | Admitting: Gynecologic Oncology

## 2019-10-12 ENCOUNTER — Inpatient Hospital Stay (HOSPITAL_COMMUNITY): Admission: RE | Admit: 2019-10-12 | Payer: No Typology Code available for payment source | Source: Ambulatory Visit

## 2019-10-12 ENCOUNTER — Other Ambulatory Visit: Payer: Self-pay

## 2019-10-12 DIAGNOSIS — Z20822 Contact with and (suspected) exposure to covid-19: Secondary | ICD-10-CM | POA: Insufficient documentation

## 2019-10-12 DIAGNOSIS — Z01812 Encounter for preprocedural laboratory examination: Secondary | ICD-10-CM | POA: Diagnosis not present

## 2019-10-12 HISTORY — DX: Unspecified condition associated with female genital organs and menstrual cycle: N94.9

## 2019-10-12 HISTORY — DX: Abnormal uterine and vaginal bleeding, unspecified: N93.9

## 2019-10-12 HISTORY — DX: Unspecified asthma, uncomplicated: J45.909

## 2019-10-12 HISTORY — DX: Attention-deficit hyperactivity disorder, unspecified type: F90.9

## 2019-10-12 HISTORY — DX: Hyperlipidemia, unspecified: E78.5

## 2019-10-12 HISTORY — DX: Anxiety disorder, unspecified: F41.9

## 2019-10-12 HISTORY — DX: Depression, unspecified: F32.A

## 2019-10-12 LAB — COMPREHENSIVE METABOLIC PANEL
ALT: 19 U/L (ref 0–44)
AST: 19 U/L (ref 15–41)
Albumin: 3.9 g/dL (ref 3.5–5.0)
Alkaline Phosphatase: 92 U/L (ref 38–126)
Anion gap: 9 (ref 5–15)
BUN: 16 mg/dL (ref 6–20)
CO2: 27 mmol/L (ref 22–32)
Calcium: 9 mg/dL (ref 8.9–10.3)
Chloride: 104 mmol/L (ref 98–111)
Creatinine, Ser: 0.65 mg/dL (ref 0.44–1.00)
GFR calc Af Amer: >60 mL/min (ref >60)
GFR calc non Af Amer: >60 mL/min (ref >60)
Glucose, Bld: 100 mg/dL — ABNORMAL HIGH (ref 70–99)
Potassium: 3.7 mmol/L (ref 3.5–5.1)
Sodium: 140 mmol/L (ref 135–145)
Total Bilirubin: 0.5 mg/dL (ref 0.3–1.2)
Total Protein: 7 g/dL (ref 6.5–8.1)

## 2019-10-12 LAB — ABO/RH: ABO/RH(D): O NEG

## 2019-10-12 LAB — CBC
HCT: 42.8 % (ref 36.0–46.0)
Hemoglobin: 13.9 g/dL (ref 12.0–15.0)
MCH: 29.6 pg (ref 26.0–34.0)
MCHC: 32.5 g/dL (ref 30.0–36.0)
MCV: 91.1 fL (ref 80.0–100.0)
Platelets: 165 10*3/uL (ref 150–400)
RBC: 4.7 MIL/uL (ref 3.87–5.11)
RDW: 13 % (ref 11.5–15.5)
WBC: 4.5 10*3/uL (ref 4.0–10.5)
nRBC: 0 % (ref 0.0–0.2)

## 2019-10-12 LAB — URINALYSIS, ROUTINE W REFLEX MICROSCOPIC
Bilirubin Urine: NEGATIVE
Glucose, UA: NEGATIVE mg/dL
Hgb urine dipstick: NEGATIVE
Ketones, ur: NEGATIVE mg/dL
Leukocytes,Ua: NEGATIVE
Nitrite: NEGATIVE
Protein, ur: NEGATIVE mg/dL
Specific Gravity, Urine: 1.018 (ref 1.005–1.030)
pH: 6 (ref 5.0–8.0)

## 2019-10-12 LAB — SARS CORONAVIRUS 2 (TAT 6-24 HRS): SARS Coronavirus 2: NEGATIVE

## 2019-10-12 NOTE — Progress Notes (Signed)
PCP - Bradly Bienenstock NP Cardiologist - N/A  Chest x-ray - N/A EKG - N/A Stress Test - N/A ECHO - N/A Cardiac Cath - N/A  Sleep Study - N/A CPAP - N/A  Fasting Blood Sugar - N/A Checks Blood Sugar __N/A___ times a day  Blood Thinner Instructions:  N/A Aspirin Instructions: N/A Last Dose: N/A  Anesthesia review:  N/A  Patient denies shortness of breath, fever, cough and chest pain at PAT appointment   Patient verbalized understanding of instructions that were given to them at the PAT appointment. Patient was also instructed that they will need to review over the PAT instructions again at home before surgery.

## 2019-10-14 ENCOUNTER — Telehealth: Payer: Self-pay

## 2019-10-14 NOTE — Telephone Encounter (Signed)
Left vm for patient to call back if she has any pre op questions.

## 2019-10-15 ENCOUNTER — Other Ambulatory Visit: Payer: Self-pay

## 2019-10-15 ENCOUNTER — Encounter (HOSPITAL_COMMUNITY)
Admission: RE | Disposition: A | Discharge status: Home / Self Care | Payer: Self-pay | Source: Home / Self Care | Attending: Gynecologic Oncology

## 2019-10-15 ENCOUNTER — Ambulatory Visit (HOSPITAL_COMMUNITY): Payer: No Typology Code available for payment source | Admitting: Certified Registered Nurse Anesthetist

## 2019-10-15 ENCOUNTER — Encounter (HOSPITAL_COMMUNITY): Payer: Self-pay | Admitting: Gynecologic Oncology

## 2019-10-15 ENCOUNTER — Ambulatory Visit (HOSPITAL_COMMUNITY)
Admission: RE | Admit: 2019-10-15 | Discharge: 2019-10-15 | Disposition: A | Discharge status: Home / Self Care | Payer: No Typology Code available for payment source | Attending: Gynecologic Oncology | Admitting: Gynecologic Oncology

## 2019-10-15 DIAGNOSIS — N7011 Chronic salpingitis: Secondary | ICD-10-CM | POA: Diagnosis not present

## 2019-10-15 DIAGNOSIS — N736 Female pelvic peritoneal adhesions (postinfective): Secondary | ICD-10-CM | POA: Insufficient documentation

## 2019-10-15 DIAGNOSIS — Z79899 Other long term (current) drug therapy: Secondary | ICD-10-CM | POA: Insufficient documentation

## 2019-10-15 DIAGNOSIS — N800 Endometriosis of uterus: Secondary | ICD-10-CM | POA: Diagnosis not present

## 2019-10-15 DIAGNOSIS — N801 Endometriosis of ovary: Secondary | ICD-10-CM | POA: Diagnosis not present

## 2019-10-15 DIAGNOSIS — N802 Endometriosis of fallopian tube: Secondary | ICD-10-CM | POA: Insufficient documentation

## 2019-10-15 DIAGNOSIS — N804 Endometriosis of rectovaginal septum and vagina: Secondary | ICD-10-CM | POA: Diagnosis not present

## 2019-10-15 DIAGNOSIS — N83202 Unspecified ovarian cyst, left side: Secondary | ICD-10-CM

## 2019-10-15 DIAGNOSIS — N72 Inflammatory disease of cervix uteri: Secondary | ICD-10-CM | POA: Diagnosis not present

## 2019-10-15 DIAGNOSIS — R1900 Intra-abdominal and pelvic swelling, mass and lump, unspecified site: Secondary | ICD-10-CM | POA: Diagnosis present

## 2019-10-15 DIAGNOSIS — R971 Elevated cancer antigen 125 [CA 125]: Secondary | ICD-10-CM | POA: Diagnosis not present

## 2019-10-15 DIAGNOSIS — N83201 Unspecified ovarian cyst, right side: Secondary | ICD-10-CM

## 2019-10-15 DIAGNOSIS — N939 Abnormal uterine and vaginal bleeding, unspecified: Secondary | ICD-10-CM

## 2019-10-15 DIAGNOSIS — L409 Psoriasis, unspecified: Secondary | ICD-10-CM | POA: Diagnosis not present

## 2019-10-15 HISTORY — PX: ROBOTIC ASSISTED TOTAL HYSTERECTOMY WITH BILATERAL SALPINGO OOPHERECTOMY: SHX6086

## 2019-10-15 LAB — TYPE AND SCREEN
ABO/RH(D): O NEG
Antibody Screen: NEGATIVE

## 2019-10-15 LAB — PREGNANCY, URINE: Preg Test, Ur: NEGATIVE

## 2019-10-15 SURGERY — HYSTERECTOMY, TOTAL, ROBOT-ASSISTED, LAPAROSCOPIC, WITH BILATERAL SALPINGO-OOPHORECTOMY
Anesthesia: General | Site: Abdomen

## 2019-10-15 MED ORDER — EPHEDRINE 5 MG/ML INJ
INTRAVENOUS | Status: AC
  Filled 2019-10-15: qty 10

## 2019-10-15 MED ORDER — ROCURONIUM BROMIDE 10 MG/ML (PF) SYRINGE
PREFILLED_SYRINGE | INTRAVENOUS | Status: DC | PRN
  Administered 2019-10-15: 60 mg via INTRAVENOUS
  Administered 2019-10-15: 5 mg via INTRAVENOUS
  Administered 2019-10-15: 20 mg via INTRAVENOUS

## 2019-10-15 MED ORDER — DEXAMETHASONE SODIUM PHOSPHATE 10 MG/ML IJ SOLN
INTRAMUSCULAR | Status: AC
  Filled 2019-10-15: qty 1

## 2019-10-15 MED ORDER — EPHEDRINE SULFATE 50 MG/ML IJ SOLN
INTRAMUSCULAR | Status: DC | PRN
  Administered 2019-10-15: 5 mg via INTRAVENOUS

## 2019-10-15 MED ORDER — MIDAZOLAM HCL 5 MG/5ML IJ SOLN
INTRAMUSCULAR | Status: DC | PRN
  Administered 2019-10-15: 2 mg via INTRAVENOUS

## 2019-10-15 MED ORDER — FENTANYL CITRATE (PF) 250 MCG/5ML IJ SOLN
INTRAMUSCULAR | Status: AC
  Filled 2019-10-15: qty 5

## 2019-10-15 MED ORDER — ACETAMINOPHEN 500 MG PO TABS
1000.0000 mg | ORAL_TABLET | ORAL | Status: AC
  Administered 2019-10-15: 1000 mg via ORAL
  Filled 2019-10-15: qty 2

## 2019-10-15 MED ORDER — PROMETHAZINE HCL 25 MG/ML IJ SOLN: 6.2500 mg | INTRAMUSCULAR | Status: DC | PRN

## 2019-10-15 MED ORDER — MEPERIDINE HCL 50 MG/ML IJ SOLN: 6.2500 mg | INTRAMUSCULAR | Status: DC | PRN

## 2019-10-15 MED ORDER — MORPHINE SULFATE (PF) 4 MG/ML IV SOLN: 2.0000 mg | INTRAVENOUS | Status: DC | PRN

## 2019-10-15 MED ORDER — ESTRADIOL 1 MG PO TABS: 1.0000 mg | ORAL_TABLET | Freq: Every day | ORAL | 2 refills | Status: DC

## 2019-10-15 MED ORDER — ACETAMINOPHEN 650 MG RE SUPP
650.0000 mg | RECTAL | Status: DC | PRN
  Filled 2019-10-15: qty 1

## 2019-10-15 MED ORDER — STERILE WATER FOR IRRIGATION IR SOLN
Status: DC | PRN
  Administered 2019-10-15: 1000 mL

## 2019-10-15 MED ORDER — FENTANYL CITRATE (PF) 100 MCG/2ML IJ SOLN
INTRAMUSCULAR | Status: DC | PRN
  Administered 2019-10-15: 100 ug via INTRAVENOUS
  Administered 2019-10-15: 50 ug via INTRAVENOUS

## 2019-10-15 MED ORDER — OXYCODONE HCL 5 MG PO TABS
5.0000 mg | ORAL_TABLET | ORAL | Status: DC | PRN
  Administered 2019-10-15: 5 mg via ORAL

## 2019-10-15 MED ORDER — SUGAMMADEX SODIUM 200 MG/2ML IV SOLN
INTRAVENOUS | Status: DC | PRN
  Administered 2019-10-15: 200 mg via INTRAVENOUS

## 2019-10-15 MED ORDER — BUPIVACAINE HCL 0.25 % IJ SOLN
INTRAMUSCULAR | Status: DC | PRN
  Administered 2019-10-15: 17 mL

## 2019-10-15 MED ORDER — BUPIVACAINE HCL 0.25 % IJ SOLN
INTRAMUSCULAR | Status: AC
  Filled 2019-10-15: qty 1

## 2019-10-15 MED ORDER — MIDAZOLAM HCL 2 MG/2ML IJ SOLN
INTRAMUSCULAR | Status: AC
  Filled 2019-10-15: qty 2

## 2019-10-15 MED ORDER — DEXAMETHASONE SODIUM PHOSPHATE 4 MG/ML IJ SOLN
4.0000 mg | INTRAMUSCULAR | Status: AC
  Administered 2019-10-15: 10 mg via INTRAVENOUS

## 2019-10-15 MED ORDER — CEFAZOLIN SODIUM-DEXTROSE 2-4 GM/100ML-% IV SOLN
2.0000 g | INTRAVENOUS | Status: AC
  Administered 2019-10-15: 2 g via INTRAVENOUS
  Filled 2019-10-15: qty 100

## 2019-10-15 MED ORDER — SODIUM CHLORIDE 0.9% FLUSH: 3.0000 mL | Freq: Two times a day (BID) | INTRAVENOUS | Status: DC

## 2019-10-15 MED ORDER — SCOPOLAMINE 1 MG/3DAYS TD PT72
1.0000 | MEDICATED_PATCH | TRANSDERMAL | Status: DC
  Administered 2019-10-15: 1.5 mg via TRANSDERMAL
  Filled 2019-10-15: qty 1

## 2019-10-15 MED ORDER — LIDOCAINE 2% (20 MG/ML) 5 ML SYRINGE
INTRAMUSCULAR | Status: AC
  Filled 2019-10-15: qty 5

## 2019-10-15 MED ORDER — PROPOFOL 10 MG/ML IV BOLUS
INTRAVENOUS | Status: DC | PRN
  Administered 2019-10-15: 150 mg via INTRAVENOUS

## 2019-10-15 MED ORDER — LIDOCAINE 2% (20 MG/ML) 5 ML SYRINGE
INTRAMUSCULAR | Status: DC | PRN
  Administered 2019-10-15: 60 mg via INTRAVENOUS

## 2019-10-15 MED ORDER — LACTATED RINGERS IR SOLN
Status: DC | PRN
  Administered 2019-10-15: 1000 mL

## 2019-10-15 MED ORDER — MIDAZOLAM HCL 2 MG/2ML IJ SOLN: 0.5000 mg | Freq: Once | INTRAMUSCULAR | Status: DC | PRN

## 2019-10-15 MED ORDER — ROCURONIUM BROMIDE 10 MG/ML (PF) SYRINGE
PREFILLED_SYRINGE | INTRAVENOUS | Status: AC
  Filled 2019-10-15: qty 10

## 2019-10-15 MED ORDER — ONDANSETRON HCL 4 MG/2ML IJ SOLN
INTRAMUSCULAR | Status: DC | PRN
  Administered 2019-10-15: 4 mg via INTRAVENOUS

## 2019-10-15 MED ORDER — SODIUM CHLORIDE 0.9 % IV SOLN: 250.0000 mL | INTRAVENOUS | Status: DC | PRN

## 2019-10-15 MED ORDER — SODIUM CHLORIDE 0.9% FLUSH: 3.0000 mL | INTRAVENOUS | Status: DC | PRN

## 2019-10-15 MED ORDER — HYDROMORPHONE HCL 1 MG/ML IJ SOLN: 0.2500 mg | INTRAMUSCULAR | Status: DC | PRN

## 2019-10-15 MED ORDER — ACETAMINOPHEN 325 MG PO TABS
ORAL_TABLET | ORAL | Status: AC
  Filled 2019-10-15: qty 2

## 2019-10-15 MED ORDER — CELECOXIB 200 MG PO CAPS
400.0000 mg | ORAL_CAPSULE | ORAL | Status: AC
  Administered 2019-10-15: 400 mg via ORAL
  Filled 2019-10-15: qty 2

## 2019-10-15 MED ORDER — ONDANSETRON HCL 4 MG/2ML IJ SOLN
INTRAMUSCULAR | Status: AC
  Filled 2019-10-15: qty 2

## 2019-10-15 MED ORDER — ACETAMINOPHEN 325 MG PO TABS
650.0000 mg | ORAL_TABLET | ORAL | Status: DC | PRN
  Administered 2019-10-15: 650 mg via ORAL

## 2019-10-15 MED ORDER — LACTATED RINGERS IV SOLN: INTRAVENOUS | Status: DC

## 2019-10-15 MED ORDER — GABAPENTIN 300 MG PO CAPS
300.0000 mg | ORAL_CAPSULE | ORAL | Status: AC
  Administered 2019-10-15: 300 mg via ORAL
  Filled 2019-10-15: qty 1

## 2019-10-15 MED ORDER — OXYCODONE HCL 5 MG PO TABS
ORAL_TABLET | ORAL | Status: AC
  Filled 2019-10-15: qty 1

## 2019-10-15 SURGICAL SUPPLY — 64 items
APPLICATOR SURGIFLO ENDO (HEMOSTASIS) IMPLANT
BACTOSHIELD CHG 4% 4OZ (MISCELLANEOUS) ×1
BAG LAPAROSCOPIC 12 15 PORT 16 (BASKET) IMPLANT
BAG RETRIEVAL 12/15 (BASKET)
BLADE SURG SZ10 CARB STEEL (BLADE) IMPLANT
COVER BACK TABLE 60X90IN (DRAPES) ×3 IMPLANT
COVER TIP SHEARS 8 DVNC (MISCELLANEOUS) ×2 IMPLANT
COVER TIP SHEARS 8MM DA VINCI (MISCELLANEOUS) ×3
COVER WAND RF STERILE (DRAPES) IMPLANT
DECANTER SPIKE VIAL GLASS SM (MISCELLANEOUS) ×3 IMPLANT
DERMABOND ADVANCED (GAUZE/BANDAGES/DRESSINGS) ×1
DERMABOND ADVANCED .7 DNX12 (GAUZE/BANDAGES/DRESSINGS) ×2 IMPLANT
DRAPE ARM DVNC X/XI (DISPOSABLE) ×8 IMPLANT
DRAPE COLUMN DVNC XI (DISPOSABLE) ×2 IMPLANT
DRAPE DA VINCI XI ARM (DISPOSABLE) ×12
DRAPE DA VINCI XI COLUMN (DISPOSABLE) ×3
DRAPE SHEET LG 3/4 BI-LAMINATE (DRAPES) ×3 IMPLANT
DRAPE SURG IRRIG POUCH 19X23 (DRAPES) ×3 IMPLANT
DRSG OPSITE POSTOP 4X6 (GAUZE/BANDAGES/DRESSINGS) IMPLANT
DRSG OPSITE POSTOP 4X8 (GAUZE/BANDAGES/DRESSINGS) IMPLANT
ELECT REM PT RETURN 15FT ADLT (MISCELLANEOUS) ×3 IMPLANT
GLOVE BIO SURGEON STRL SZ 6 (GLOVE) ×12 IMPLANT
GLOVE BIO SURGEON STRL SZ 6.5 (GLOVE) ×6 IMPLANT
GOWN STRL REUS W/ TWL LRG LVL3 (GOWN DISPOSABLE) ×8 IMPLANT
GOWN STRL REUS W/TWL LRG LVL3 (GOWN DISPOSABLE) ×12
HOLDER FOLEY CATH W/STRAP (MISCELLANEOUS) ×3 IMPLANT
IRRIG SUCT STRYKERFLOW 2 WTIP (MISCELLANEOUS) ×3
IRRIGATION SUCT STRKRFLW 2 WTP (MISCELLANEOUS) ×2 IMPLANT
KIT PROCEDURE DA VINCI SI (MISCELLANEOUS)
KIT PROCEDURE DVNC SI (MISCELLANEOUS) IMPLANT
KIT TURNOVER KIT A (KITS) IMPLANT
MANIPULATOR UTERINE 4.5 ZUMI (MISCELLANEOUS) ×3 IMPLANT
NEEDLE HYPO 22GX1.5 SAFETY (NEEDLE) ×3 IMPLANT
NEEDLE SPNL 18GX3.5 QUINCKE PK (NEEDLE) IMPLANT
OBTURATOR OPTICAL STANDARD 8MM (TROCAR) ×3
OBTURATOR OPTICAL STND 8 DVNC (TROCAR) ×2
OBTURATOR OPTICALSTD 8 DVNC (TROCAR) ×2 IMPLANT
PACK ROBOT GYN WLCUSTOM (TRAY / TRAY PROCEDURE) ×3 IMPLANT
PAD POSITIONING PINK XL (MISCELLANEOUS) ×3 IMPLANT
PENCIL SMOKE EVACUATOR (MISCELLANEOUS) IMPLANT
PORT ACCESS TROCAR AIRSEAL 12 (TROCAR) ×2 IMPLANT
PORT ACCESS TROCAR AIRSEAL 5M (TROCAR) ×1
POUCH SPECIMEN RETRIEVAL 10MM (ENDOMECHANICALS) IMPLANT
SCRUB CHG 4% DYNA-HEX 4OZ (MISCELLANEOUS) ×2 IMPLANT
SEAL CANN UNIV 5-8 DVNC XI (MISCELLANEOUS) ×6 IMPLANT
SEAL XI 5MM-8MM UNIVERSAL (MISCELLANEOUS) ×9
SET TRI-LUMEN FLTR TB AIRSEAL (TUBING) ×3 IMPLANT
SPONGE LAP 18X18 RF (DISPOSABLE) ×3 IMPLANT
SURGIFLO W/THROMBIN 8M KIT (HEMOSTASIS) IMPLANT
SUT MNCRL AB 4-0 PS2 18 (SUTURE) IMPLANT
SUT PDS AB 1 TP1 96 (SUTURE) IMPLANT
SUT VIC AB 0 CT1 27 (SUTURE) ×3
SUT VIC AB 0 CT1 27XBRD ANTBC (SUTURE) ×2 IMPLANT
SUT VIC AB 2-0 CT1 27 (SUTURE)
SUT VIC AB 2-0 CT1 TAPERPNT 27 (SUTURE) IMPLANT
SUT VICRYL 4-0 PS2 18IN ABS (SUTURE) ×6 IMPLANT
SYR 10ML LL (SYRINGE) IMPLANT
TOWEL OR NON WOVEN STRL DISP B (DISPOSABLE) ×3 IMPLANT
TRAP SPECIMEN MUCUS 40CC (MISCELLANEOUS) ×3 IMPLANT
TRAY FOLEY MTR SLVR 16FR STAT (SET/KITS/TRAYS/PACK) ×3 IMPLANT
TROCAR XCEL NON-BLD 5MMX100MML (ENDOMECHANICALS) IMPLANT
UNDERPAD 30X36 HEAVY ABSORB (UNDERPADS AND DIAPERS) ×3 IMPLANT
WATER STERILE IRR 1000ML POUR (IV SOLUTION) ×3 IMPLANT
YANKAUER SUCT BULB TIP 10FT TU (MISCELLANEOUS) IMPLANT

## 2019-10-15 NOTE — Interval H&P Note (Signed)
History and Physical Interval Note:  10/15/2019 9:42 AM  Brandi Hill  has presented today for surgery, with the diagnosis of ABNORMAL UTERINE BLEEDING, BILATERAL OVARIAN CYST.  The various methods of treatment have been discussed with the patient and family. After consideration of risks, benefits and other options for treatment, the patient has consented to  Procedure(s): XI ROBOTIC [place] (Bilateral) OVARIAN CYSTECTOMY (N/A) as a surgical intervention.  The patient's history has been reviewed, patient examined, no change in status, stable for surgery.  I have reviewed the patient's chart and labs.  Questions were answered to the patient's satisfaction.     Thereasa Solo

## 2019-10-15 NOTE — Op Note (Signed)
OPERATIVE NOTE 10/15/19  Surgeon: Donaciano Eva   Assistants: Dr Brandi Hill (an MD assistant was necessary for tissue manipulation, management of robotic instrumentation, retraction and positioning due to the complexity of the case and hospital policies).   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   Surgeon: Donaciano Eva  Assistant Surgeon: Brandi Crocker MD  Anesthesia: GET  Urine Output: 300cc  Operative Findings:  : 8cm uterus, grossly normal appearing, densely adherent posteriorally to the bilateral ovaries which were replaced by a 8cm complex old blood-containing cyst on the right and a left hydrosalpinx. The ovaries were kissing posteriorally in the midline and densely adherent to the rectum posteriorally.  The upper abdomen was grossly normal. The appendix was normal.  Estimated Blood Loss:  100cc      Total IV Fluids: 800 ml         Specimens: washing, uterus with cervix bilateral tubes and ovaries.          Complications:  None; patient tolerated the procedure well.         Disposition: PACU - hemodynamically stable.  Procedure Details  The patient was seen in the Holding Room. The risks, benefits, complications, treatment options, and expected outcomes were discussed with the patient.  The patient concurred with the proposed plan, giving informed consent.  The site of surgery properly noted/marked. The patient was identified as Brandi Hill and the procedure verified as a Robotic-assisted hysterectomy with bilateral salpingo oophorectomy. A Time Out was held and the above information confirmed.  After induction of anesthesia, the patient was draped and  prepped in the usual sterile manner. Pt was placed in supine position after anesthesia and draped and prepped in the usual sterile manner. The abdominal drape was placed after the CholoraPrep had been allowed to dry for 3 minutes.  Her arms were tucked to her side with all appropriate precautions.  The shoulders were stabilized with padded shoulder blocks applied to the acromium processes.  The patient was placed in the semi-lithotomy position in [place].  The perineum was prepped with Betadine. The patient was then prepped. Foley catheter was placed.  A sterile speculum was placed in the vagina.  The cervix was grasped with a single-tooth tenaculum and dilated with Kennon Rounds dilators.  The ZUMI uterine manipulator with a medium colpotomizer ring was placed without difficulty.  A pneum occluder balloon was placed over the manipulator.  OG tube placement was confirmed and to suction.   Next, a 5 mm skin incision was made 1 cm below the subcostal margin in the midclavicular line.  The 5 mm Optiview port and scope was used for direct entry.  Opening pressure was under 10 mm CO2.  The abdomen was insufflated and the findings were noted as above.   At this point and all points during the procedure, the patient's intra-abdominal pressure did not exceed 15 mmHg. Next, a 10 mm skin incision was made in the umbilicus and a right and left port was placed about 10 cm lateral to the robot port on the right and left side.  A fourth arm was placed in the left lower quadrant 2 cm above and superior and medial to the anterior superior iliac spine.  All ports were placed under direct visualization.  The patient  was placed in steep Trendelenburg.  Bowel was folded away into the upper abdomen.  The robot was docked in the normal manner.  For 45 minutes sharp dissection was performed to skeletonize and separate the ovaries from the rectum posteriorally and eachother. There was unavoidable cyst rupture during adhesiolysis.    The hysterectomy was started after the round ligament on the right side was incised and the retroperitoneum was entered and the pararectal space was developed.  The ureter was noted to be on the medial leaf of the broad ligament, and was carefully dissected free from the retroperitoneal structures.  The peritoneum above the ureter was incised and stretched and the infundibulopelvic ligament was skeletonized, cauterized and cut.  The posterior peritoneum was taken down to the level of the KOH ring with careful dissection to separate it from the ureter and the rectum.  The anterior peritoneum was also taken down.  The bladder flap was created to the level of the KOH ring.  The uterine artery on the right side was skeletonized, cauterized and cut in the normal manner.  A similar procedure was performed on the left.  A similar advanced ureterolysis on the left was necessary to dissect the ureter free from its retroperitoneal attachments to the endometriosis centrally. The colpotomy was made and the uterus, cervix, bilateral ovaries and tubes were amputated and delivered through the vagina.  Pedicles were inspected and excellent hemostasis was achieved.    Frozen section confirmed endometriosis.  The colpotomy at the vaginal cuff was closed with Vicryl on a CT1 needle in a running manner.  Irrigation was used and excellent hemostasis was achieved.  At this point in the procedure was completed.  Robotic instruments were removed under direct visulaization.  The robot was undocked. The 10 mm ports were closed with Vicryl on a UR-5 needle and the fascia was closed with 0 Vicryl on a UR-5 needle.  The skin was closed with 4-0 Vicryl in a subcuticular manner.  Dermabond was applied.  Sponge, lap and needle counts correct x 2.  The patient was taken to the recovery room in stable condition.  The vagina was swabbed with  minimal bleeding noted.   All instrument and needle counts were correct x  3.   The patient was  transferred to the recovery room in a stable condition.  Donaciano Eva, MD

## 2019-10-15 NOTE — Anesthesia Postprocedure Evaluation (Signed)
Anesthesia Post Note  Patient: Brandi Hill  Procedure(s) Performed: XI ROBOTIC ASSISTED TOTAL HYSTERECTOMY WITH BILATERAL  SALPINGO OOPHORECTOMY/LYSIS OF ADHESIONS (Bilateral Abdomen)     Patient location during evaluation: PACU Anesthesia Type: General Level of consciousness: awake and alert, oriented and patient cooperative Pain management: pain level controlled Vital Signs Assessment: post-procedure vital signs reviewed and stable Respiratory status: spontaneous breathing, nonlabored ventilation and respiratory function stable Cardiovascular status: blood pressure returned to baseline and stable Postop Assessment: no apparent nausea or vomiting Anesthetic complications: no    Last Vitals:  Vitals:   10/15/19 1445 10/15/19 1517  BP: (!) 132/91 120/86  Pulse: 87 81  Resp: 16 15  Temp:  36.5 C  SpO2: 100% 98%    Last Pain:  Vitals:   10/15/19 1445  TempSrc:   PainSc: 3                  Leandrea Ackley,E. Venie Montesinos

## 2019-10-15 NOTE — Discharge Instructions (Signed)
Return to work: 4 weeks (2 weeks with physical restrictions).  Activity: 1. Be up and out of the bed during the day.  Take a nap if needed.  You may walk up steps but be careful and use the hand rail.  Stair climbing will tire you more than you think, you may need to stop part way and rest.   2. No lifting or straining for 4 weeks.  3. No driving for 1 weeks.  Do Not drive if you are taking narcotic pain medicine.  4. Shower daily.  Use soap and water on your incision and pat dry; don't rub.   5. No sexual activity and nothing in the vagina for 8 weeks.  Medications:  - Take ibuprofen and tylenol first line for pain control. Take these regularly (every 6 hours) to decrease the build up of pain.  - If necessary, for severe pain not relieved by ibuprofen, contact Dr Rossi's office and you will be prescribed percocet.  - While taking percocet you should take sennakot every night to reduce the likelihood of constipation. If this causes diarrhea, stop its use.  Diet: 1. Low sodium Heart Healthy Diet is recommended.  2. It is safe to use a laxative if you have difficulty moving your bowels.   Wound Care: 1. Keep clean and dry.  Shower daily.  Reasons to call the Doctor:   Fever - Oral temperature greater than 100.4 degrees Fahrenheit  Foul-smelling vaginal discharge  Difficulty urinating  Nausea and vomiting  Increased pain at the site of the incision that is unrelieved with pain medicine.  Difficulty breathing with or without chest pain  New calf pain especially if only on one side  Sudden, continuing increased vaginal bleeding with or without clots.   Follow-up: 1. See Emma Rossi in 4 weeks.  Contacts: For questions or concerns you should contact:  Dr. Emma Rossi at [phone] After hours and on week-ends call [phone] and ask to speak to the physician on call for Gynecologic Oncology  After Your Surgery  The information in this section will tell you what  to expect after your surgery, both during your stay and after you leave. You will learn how to safely recover from your surgery. Write down any questions you have and be sure to ask your doctor or nurse.  What to Expect When you wake up after your surgery, you will be in the Post-Anesthesia Care Unit (PACU) or your recovery room. A nurse will be monitoring your body temperature, blood pressure, pulse, and oxygen levels. You may have a urinary catheter in your bladder to help monitor the amount of urine you are making. It should come out before you go home. You will also have compression boots on your lower legs to help your circulation. Your pain medication will be given through an IV line or in tablet form. If you are having pain, tell your nurse. Your nurse will tell you how to recover from your surgery. Below are examples of ways you can help yourself recover safely. . You will be encouraged to walk with the help of your nurse or physical therapist. We will give you medication to relieve pain. Walking helps reduce the risk for blood clots and pneumonia. It also helps to stimulate your bowels so they begin working again. . Use your incentive spirometer. This will help your lungs expand, which prevents pneumonia.   Commonly Asked Questions  Will I have pain after surgery? Yes, you will have some   pain after your surgery, especially in the first few days. Your doctor and nurse will ask you about your pain often. You will be given medication to manage your pain as needed. If your pain is not relieved, please tell your doctor or nurse. It is important to control your pain so you can cough, breathe deeply, use your incentive spirometer, and get out of bed and walk.  Will I be able to eat? Yes, you will be able to eat a regular diet or eat as tolerated. You should start with foods that are soft and easy to digest such as apple sauce and chicken noodle soup. Eat small meals frequently, and then advance  to regular foods. If you experience bloating, gas, or cramps, limit high-fiber foods, including whole grain breads and cereal, nuts, seeds, salads, fresh fruit, broccoli, cabbage, and cauliflower. Will I have pain when I am home? The length of time each person has pain or discomfort varies. You may still have some pain when you go home and will probably be taking pain medication. Follow the guidelines below. . Take your medications as directed and as needed. . Call your doctor if the medication prescribed for you doesn't relieve your pain. . Don't drive or drink alcohol while you're taking prescription pain medication. . As your incision heals, you will have less pain and need less pain medication. A mild pain reliever such as acetaminophen (Tylenol) or ibuprofen (Advil) will relieve aches and discomfort. However, large quantities of acetaminophen may be harmful to your liver. Don't take more acetaminophen than the amount directed on the bottle or as instructed by your doctor or nurse. . Pain medication should help you as you resume your normal activities. Take enough medication to do your exercises comfortably. Pain medication is most effective 30 to 45 minutes after taking it. . Keep track of when you take your pain medication. Taking it when your pain first begins is more effective than waiting for the pain to get worse. Pain medication may cause constipation (having fewer bowel movements than what is normal for you).  How can I prevent constipation? . Go to the bathroom at the same time every day. Your body will get used to going at that time. . If you feel the urge to go, don't put it off. Try to use the bathroom 5 to 15 minutes after meals. . After breakfast is a good time to move your bowels. The reflexes in your colon are strongest at this time. . Exercise, if you can. Walking is an excellent form of exercise. . Drink 8 (8-ounce) glasses (2 liters) of liquids daily, if you can. Drink  water, juices, soups, ice cream shakes, and other drinks that don't have caffeine. Drinks with caffeine, such as coffee and soda, pull fluid out of the body. . Slowly increase the fiber in your diet to 25 to 35 grams per day. Fruits, vegetables, whole grains, and cereals contain fiber. If you have an ostomy or have had recent bowel surgery, check with your doctor or nurse before making any changes in your diet. . Both over-the-counter and prescription medications are available to treat constipation. Start with 1 of the following over-the-counter medications first: o Docusate sodium (Colace) 100 mg. Take ___1__ capsules _2____ times a day. This is a stool softener that causes few side effects. Don't take it with mineral oil. o Polyethylene glycol (MiraLAX) 17 grams daily. o Senna (Senokot) 2 tablets at bedtime. This is a stimulant laxative, which can cause cramping. .   If you haven't had a bowel movement in 2 days, call your doctor or nurse.  Can I shower? Yes, you should shower 24 hours after your surgery. Be sure to shower every day. Taking a warm shower is relaxing and can help decrease muscle aches. Use soap when you shower and gently wash your incision. Pat the areas dry with a towel after showering, and leave your incision uncovered (unless there is drainage). Call your doctor if you see any redness or drainage from your incision. Don't take tub baths until you discuss it with your doctor at the first appointment after your surgery. How do I care for my incisions? You will have several small incisions on your abdomen. The incisions are closed with Steri-Strips or Dermabond. You may also have square white dressings on your incisions (Primapore). You can remove these in the shower 24 hours after your surgery. You should clean your incisions with soap and water. If you go home with Steri-Strips on your incision, they will loosen and may fall off by themselves. If they haven't fallen off within 10  days, you can remove them. If you go home with Dermabond over your sutures (stitches), it will also loosen and peel off.  What are the most common symptoms after a hysterectomy? It's common for you to have some vaginal spotting or light bleeding. You should monitor this with a pad or a panty liner. If you have having heavy bleeding (bleeding through a pad or liner every 1 to 2 hours), call your doctor right away. It's also common to have some discomfort after surgery from the air that was pumped into your abdomen during surgery. To help with this, walk, drink plenty of liquids and make sure to take the stool softeners you received.  When is it safe for me to drive? You may resume driving 2 weeks after surgery, as long as you are not taking pain medication that may make you drowsy.  When can I resume sexual activity? Do not place anything in your vagina or have vaginal intercourse for 8 weeks after your surgery. Some people will need to wait longer than 8 weeks, so speak with your doctor before resuming sexual intercourse.  Will I be able to travel? Yes, you can travel. If you are traveling by plane within a few weeks after your surgery, make sure you get up and walk every hour. Be sure to stretch your legs, drink plenty of liquids, and keep your feet elevated when possible.  Will I need any supplies? Most people do not need any supplies after the surgery. In the rare case that you do need supplies, such as tubes or drains, your nurse will order them for you.  When can I return to work? The time it takes to return to work depends on the type of work you do, the type of surgery you had, and how fast your body heals. Most people can return to work about 2 to 4 weeks after the surgery.  What exercises can I do? Exercise will help you gain strength and feel better. Walking and stair climbing are excellent forms of exercise. Gradually increase the distance you walk. Climb stairs slowly, resting or  stopping as needed. Ask your doctor or nurse before starting more strenuous exercises.  When can I lift heavy objects? Most people should not lift anything heavier than 10 pounds (4.5 kilograms) for at least 4 weeks after surgery. Speak with your doctor about when you can do heavy lifting.    How can I cope with my feelings? After surgery for a serious illness, you may have new and upsetting feelings. Many people say they felt weepy, sad, worried, nervous, irritable, and angry at one time or another. You may find that you can't control some of these feelings. If this happens, it's a good idea to seek emotional support. The first step in coping is to talk about how you feel. Family and friends can help. Your nurse, doctor, and social worker can reassure, support, and guide you. It's always a good idea to let these professionals know how you, your family, and your friends are feeling emotionally. Many resources are available to patients and their families. Whether you're in the hospital or at home, the nurses, doctors, and social workers are here to help you and your family and friends handle the emotional aspects of your illness.  When is my first appointment after surgery? Your first appointment after surgery will be 2 to 4 weeks after surgery. Your nurse will give you instructions on how to make this appointment, including the phone number to call.  What if I have other questions? If you have any questions or concerns, please talk with your doctor or nurse. You can reach them Monday through Friday from 9:00 am to 5:00 pm. After 5:00 pm, during the weekend, and on holidays, call [phone] and ask for the doctor on call for your doctor.  . Have a temperature of 101 F (38.3 C) or higher . Have pain that does not get better with pain medication . Have redness, drainage, or swelling from your incisions

## 2019-10-15 NOTE — Anesthesia Preprocedure Evaluation (Signed)
Anesthesia Evaluation  Patient identified by MRN, date of birth, ID band Patient awake    Reviewed: Allergy & Precautions, NPO status , Patient's Chart, lab work & pertinent test results  History of Anesthesia Complications Negative for: history of anesthetic complications  Airway Mallampati: II  TM Distance: >3 FB Neck ROM: Full    Dental  (+) Dental Advisory Given   Pulmonary  10/12/2019 SARS coronavirus NEG   breath sounds clear to auscultation       Cardiovascular negative cardio ROS   Rhythm:Regular Rate:Normal     Neuro/Psych negative neurological ROS     GI/Hepatic negative GI ROS, Neg liver ROS,   Endo/Other  negative endocrine ROS  Renal/GU negative Renal ROS     Musculoskeletal   Abdominal   Peds  Hematology negative hematology ROS (+)   Anesthesia Other Findings   Reproductive/Obstetrics                             Anesthesia Physical Anesthesia Plan  ASA: II  Anesthesia Plan: General   Post-op Pain Management:    Induction: Intravenous  PONV Risk Score and Plan: 3 and Ondansetron, Dexamethasone and Scopolamine patch - Pre-op  Airway Management Planned: Oral ETT  Additional Equipment: None  Intra-op Plan:   Post-operative Plan: Extubation in OR  Informed Consent: I have reviewed the patients History and Physical, chart, labs and discussed the procedure including the risks, benefits and alternatives for the proposed anesthesia with the patient or authorized representative who has indicated his/her understanding and acceptance.     Dental advisory given  Plan Discussed with: CRNA and Surgeon  Anesthesia Plan Comments:         Anesthesia Quick Evaluation

## 2019-10-15 NOTE — Transfer of Care (Signed)
Immediate Anesthesia Transfer of Care Note  Patient: Brandi Hill  Procedure(s) Performed: Procedure(s): XI ROBOTIC ASSISTED TOTAL HYSTERECTOMY WITH BILATERAL  SALPINGO OOPHORECTOMY/LYSIS OF ADHESIONS (Bilateral)  Patient Location: PACU  Anesthesia Type:General  Level of Consciousness: Patient easily awoken, sedated, comfortable, cooperative, following commands, responds to stimulation.   Airway & Oxygen Therapy: Patient spontaneously breathing, ventilating well, oxygen via simple oxygen mask.  Post-op Assessment: Report given to PACU RN, vital signs reviewed and stable, moving all extremities.   Post vital signs: Reviewed and stable.  Complications: No apparent anesthesia complications  Last Vitals:  Vitals Value Taken Time  BP 114/75 10/15/19 1238  Temp    Pulse 82 10/15/19 1240  Resp 13 10/15/19 1240  SpO2 100 % 10/15/19 1240  Vitals shown include unvalidated device data.  Last Pain:  Vitals:   10/15/19 0826  TempSrc:   PainSc: 2       Patients Stated Pain Goal: 4 ([id number])  Complications: No apparent anesthesia complications

## 2019-10-15 NOTE — Anesthesia Procedure Notes (Signed)
Procedure Name: Intubation Date/Time: 10/15/2019 10:30 AM Performed by: Deliah Boston, CRNA Pre-anesthesia Checklist: Patient identified, Emergency Drugs available, Suction available and Patient being monitored Patient Re-evaluated:Patient Re-evaluated prior to induction Oxygen Delivery Method: Circle system utilized Preoxygenation: Pre-oxygenation with 100% oxygen Induction Type: IV induction Ventilation: Mask ventilation without difficulty Laryngoscope Size: Mac and 3 Grade View: Grade II Tube type: Oral Tube size: 7.0 mm Number of attempts: 1 Airway Equipment and Method: Stylet and Oral airway Placement Confirmation: ETT inserted through vocal cords under direct vision,  positive ETCO2 and breath sounds checked- equal and bilateral Secured at: 22 cm Tube secured with: Tape Dental Injury: Teeth and Oropharynx as per pre-operative assessment

## 2019-10-16 ENCOUNTER — Telehealth: Payer: Self-pay

## 2019-10-16 LAB — SURGICAL PATHOLOGY

## 2019-10-16 LAB — CYTOLOGY - NON PAP

## 2019-10-16 NOTE — Telephone Encounter (Signed)
Told Ms Kaine that the final pathology report showed no cancer. Endometriosis noted per Joylene John, NP. Pt verbalized understanding.

## 2019-10-16 NOTE — Telephone Encounter (Signed)
Brandi Hill states that she is eating,drinking, and urinating well.  She is passing gas. She will increase the senokot to 2 tablets bid. Afebrile. Incisions are D&I Pain well controled with Ibuprofen and tylenol. She is aware of her post op appointment and the office number [phone] to call if she has any questions or concerns.

## 2019-10-23 ENCOUNTER — Telehealth: Payer: Self-pay

## 2019-10-23 NOTE — Telephone Encounter (Signed)
Brandi Hill stated that she tested positive for Covid on 10-22-19. Her son tested positive on 10-16-19 and her husband tested positive on 10-19-19. She has a cough ,chills and temp of 100.2 on 10-21-19 and decided to get tested. She feels fine today. Reviewed with Melissa Cross,NP. No intervention needed from a surgical standpoint.  Pt verbalized understanding.

## 2019-11-10 ENCOUNTER — Inpatient Hospital Stay: Payer: No Typology Code available for payment source | Attending: Gynecologic Oncology | Admitting: Gynecologic Oncology

## 2019-11-10 ENCOUNTER — Ambulatory Visit: Payer: No Typology Code available for payment source | Admitting: Gynecologic Oncology

## 2019-11-10 ENCOUNTER — Other Ambulatory Visit: Payer: Self-pay

## 2019-11-10 ENCOUNTER — Encounter: Payer: Self-pay | Admitting: Gynecologic Oncology

## 2019-11-10 VITALS — BP 123/86 | HR 83 | Temp 98.6°F | Resp 15 | Ht 65.5 in | Wt 153.2 lb

## 2019-11-10 DIAGNOSIS — N83202 Unspecified ovarian cyst, left side: Secondary | ICD-10-CM | POA: Insufficient documentation

## 2019-11-10 DIAGNOSIS — Z79890 Hormone replacement therapy (postmenopausal): Secondary | ICD-10-CM

## 2019-11-10 DIAGNOSIS — Z90722 Acquired absence of ovaries, bilateral: Secondary | ICD-10-CM | POA: Insufficient documentation

## 2019-11-10 DIAGNOSIS — N809 Endometriosis, unspecified: Secondary | ICD-10-CM

## 2019-11-10 DIAGNOSIS — N83201 Unspecified ovarian cyst, right side: Secondary | ICD-10-CM | POA: Insufficient documentation

## 2019-11-10 DIAGNOSIS — Z800 Family history of malignant neoplasm of digestive organs: Secondary | ICD-10-CM | POA: Insufficient documentation

## 2019-11-10 DIAGNOSIS — Z90710 Acquired absence of both cervix and uterus: Secondary | ICD-10-CM | POA: Insufficient documentation

## 2019-11-10 NOTE — Patient Instructions (Signed)
Dr Denman George recommends that you continue to see Dr Helane Rima for prescription of your estrogen replacement, and well woman checks.   Dr Serita Grit office can be reached for questions at [phone].  You are cleared to return to all physical activity (besides intercourse) on 11/15/19. You are cleared to resume intercourse on 12/15/19.

## 2019-11-10 NOTE — Progress Notes (Signed)
Consult Note: Gyn-Onc  Consult was requested by Dr. Helane Rima for the evaluation of Brandi Hill 49 y.o. female  CC:  Chief Complaint  Patient presents with  . Bilateral ovarian cysts    Follow up    Assessment/Plan:  Brandi Hill  is a 49 y.o.  year old with a history of stage IV endometriosis s/p robotic total hysterectomy, BSO and lysis of adhesions on 10/15/19.  She is receiving estradiol HRT, and I recommend this for at least another 2 years or so, with discontinuation at the discretion of the patient and her treating gynecologist.  I recommend follow-up annually with Dr Helane Rima.  She was counseled regarding returning to normal activities.   HPI: Brandi Hill is a 49 year old P2 who was seen in consultation at the request of Dr. Helane Rima for evaluation of a bilateral adnexal masses and elevated Ca1 25.  The patient reported irregular sporadic periods and abdominal bloating since February 2021.  Pelvic examination by her gynecologist felt symptoms were consistent with fibroids.  A transvaginal ultrasound scan was performed on September 21, 2019 and confirmed a uterus measuring 8.8 x 3.4 x 5.4 cm with a 1.15 cm endometrial thickness.  The right ovary was enlarged measuring 8.4 x 5.4 x 7.2 cm and the left ovary was enlarged measuring 3.9 x 3.7 x 3.8 cm.  The right ovary contained an 8.8 x 5.51 cm cyst filled with low-level echoes and a 10 mm solid area on the cyst wall.  It was consistent with either a dermoid or endometrioma.  The left ovary contained multiple small simple appearing cysts with no blood flow seen within the cyst.  There was no free fluid seen.  A Ca1 25 was drawn the same day which was elevated at 64.3.  An endometrial biopsy was also obtained on Sep 25, 2017 in the past and revealed proliferative phase endometrium.  Due to the elevation in Ca1 25 a CT scan of the abdomen and pelvis was ordered and performed on Sep 28, 2019.  It revealed a uterus unremarkable in  appearance with an ovoid cystic lesion seen in the right adnexa showing mild peripheral wall thickening measuring 8.9 x 6 cm.  A complex cystic lesion containing multiple thin internal septations was seen in the left adnexa which measured 5.4 x 4 cm.  There is no evidence of peritoneal thickening, nodularity or ascites.  There was several tiny indeterminate right hepatic lobe lesions.  The patient is otherwise fairly healthy.  She has psoriasis and takes Stelara for this.  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  She is not had her Covid vaccine as she is nervous about side effects and risks of the vaccine.  She has had 2 prior vaginal deliveries and no abdominal surgeries.  She lives with her husband.  Interval Hx:  On 10/15/19 she underwent robotic assisted total hysterectomy, BSO and lysis of adhesions. Intraoperative findings were significant for an 8 cm uterus, grossly appearing uterus.  The uterus was dense adherent posteriorly to the bilateral ovaries which were replaced by an 8 cm complex of old blood containing cyst on the right and left hydrosalpinx.  The ovaries were kissing posteriorly in the midline and densely adherent to the rectum posteriorly.  The upper abdomen was grossly normal.  The appendix was normal. Surgery was challenging due to the complex adhesiolysis necessary but uncomplicated.  Final pathology revealed benign endometriosis.  Since surgery she developed symptomatic COVID-19 (testing positive on  10/19/19). Her husband was severely ill with the infection and required 8 days of hospitalization with Covid pneumonia.  The patient was symptomatic with fevers and chills but no pneumonia.  She has no surgical related complications or side effects.  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  Current Meds:  Outpatient Encounter Medications as of 11/10/2019  Medication Sig  . acetaminophen  (TYLENOL) 500 MG tablet Take 500-1,000 mg by mouth every 6 (six) hours as needed for mild pain or moderate pain.  Marland Kitchen amphetamine-dextroamphetamine (ADDERALL) 20 MG tablet Take 20 mg by mouth daily.   Marland Kitchen buPROPion (WELLBUTRIN SR) 150 MG 12 hr tablet Take 150 mg by mouth every morning.   Marland Kitchen estradiol (ESTRACE) 1 MG tablet Take 1 tablet (1 mg total) by mouth daily.  Marland Kitchen ibuprofen (ADVIL) 800 MG tablet Take 1 tablet (800 mg total) by mouth every 8 (eight) hours as needed for moderate pain. For AFTER surgery  . LORazepam (ATIVAN) 1 MG tablet Take 1 mg by mouth as needed for anxiety.  . Multiple Vitamins-Minerals (MULTIVITAMIN ADULT EXTRA C PO) Take 1 tablet by mouth daily. Gummie  . oxyCODONE (OXY IR/ROXICODONE) 5 MG immediate release tablet Take 1 tablet (5 mg total) by mouth every 4 (four) hours as needed for severe pain. For AFTER surgery only, do not take and drive  . senna-docusate (SENOKOT-S) 8.6-50 MG tablet Take 2 tablets by mouth at bedtime.  Delsa Grana 45 MG/0.5ML SOSY injection Inject 45 mg into the skin See admin instructions. Every 4 months   No facility-administered encounter medications on file as of 11/10/2019.    Allergy:  Allergies  Allergen Reactions  . Bee Venom Other (See Comments)    unknown    Social Hx:   Social History   Socioeconomic History  . Marital status: Married    Spouse name: Not on file  . Number of children: Not on file  . Years of education: Not on file  . Highest education level: Not on file  Occupational History  . Not on file  Tobacco Use  . Smoking status: Never Smoker  . Smokeless tobacco: Never Used  Vaping Use  . Vaping Use: Never used  Substance and Sexual Activity  . Alcohol use: Not Currently    Comment: occasionally  . Drug use: No  . Sexual activity: Yes    Birth control/protection: Surgical    Comment: VAS  Other Topics Concern  . Not on file  Social History Narrative  . Not on file   Social Determinants of Health   Financial  Resource Strain:   . Difficulty of Paying Living Expenses:   Food Insecurity:   . Worried About Charity fundraiser in the Last Year:   . Arboriculturist in the Last Year:   Transportation Needs:   . Film/video editor (Medical):   Marland Kitchen Lack of Transportation (Non-Medical):   Physical Activity:   . Days of Exercise per Week:   . Minutes of Exercise per Session:   Stress:   . Feeling of Stress :   Social Connections:   . Frequency of Communication with Friends and Family:   . Frequency of Social Gatherings with Friends and Family:   . Attends Religious Services:   . Active Member of Clubs or Organizations:   . Attends Archivist Meetings:   Marland Kitchen Marital Status:   Intimate Partner Violence:   . Fear of Current or  Ex-Partner:   . Emotionally Abused:   Marland Kitchen Physically Abused:   . Sexually Abused:     Past Surgical Hx:  Past Surgical History:  Procedure Laterality Date  . BREAST BIOPSY  10/17/2004  . ROBOTIC ASSISTED TOTAL HYSTERECTOMY WITH BILATERAL SALPINGO OOPHERECTOMY Bilateral 10/15/2019   Procedure: XI ROBOTIC ASSISTED TOTAL HYSTERECTOMY WITH BILATERAL  SALPINGO OOPHORECTOMY/LYSIS OF ADHESIONS;  Surgeon: Everitt Amber, MD;  Location: [facility];  Service: Gynecology;  Laterality: Bilateral;  . TONSILLECTOMY     AGE 35    Past Medical Hx:  Past Medical History:  Diagnosis Date  . Abnormal uterine bleeding (AUB)   . ADHD   . Adnexal cyst    Bilateral  . Anxiety   . Asthma    symptoms during cold or bronchitis  . Blood type, Rh negative   . 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 Depression   . HPV (human papilloma virus) anogenital infection    HISTORY OF HPV   . Hyperlipidemia   . Psoriasis     Past Gynecological History:  See HPi No LMP recorded.  Family Hx:  Family History  Problem Relation Age of Onset  . Depression Mother   . Hypertension Father   . Diabetes Maternal Grandmother   . Cancer Maternal Grandfather        COLON CANCER  .  Hypertension Maternal Grandfather   . Psoriasis Paternal Grandmother     Review of Systems:  Constitutional  Feels well,    ENT Normal appearing ears and nares bilaterally Skin/Breast  No rash, sores, jaundice, itching, dryness Cardiovascular  No chest pain, shortness of breath, or edema  Pulmonary  No cough or wheeze.  Gastro Intestinal  No nausea, vomitting, or diarrhoea. No bright red blood per rectum, no abdominal pain, change in bowel movement, or constipation.  Genito Urinary  No frequency, urgency, dysuria, see HPI Musculo Skeletal  No myalgia, arthralgia, joint swelling or pain  Neurologic  No weakness, numbness, change in gait,  Psychology  No depression, anxiety, insomnia.   Vitals:  Blood pressure 123/86, pulse 83, temperature 98.6 F (37 C), temperature source Oral, resp. rate 15, height 5' 5.5" (1.664 m), weight 153 lb 3.2 oz (69.5 kg), SpO2 99 %.  Physical Exam: WD in NAD Neck  Supple NROM, without any enlargements.  Lymph Node Survey No cervical supraclavicular or inguinal adenopathy Cardiovascular  Pulse normal rate, regularity and rhythm. S1 and S2 normal.  Lungs  Clear to auscultation bilateraly, without wheezes/crackles/rhonchi. Good air movement.  Skin  No rash/lesions/breakdown  Psychiatry  Alert and oriented to person, place, and time  Abdomen  Normoactive bowel sounds, abdomen soft, non-tender and nonobese without evidence of hernia. Well healed incisions. Back No CVA tenderness Genito Urinary  Vaginal cuff well healed, no blood.  Rectal  deferred Extremities  No bilateral cyanosis, clubbing or edema.   Thereasa Solo, MD  11/10/2019, 2:01 PM

## 2019-11-16 ENCOUNTER — Telehealth: Payer: Self-pay | Admitting: *Deleted

## 2019-11-16 NOTE — Telephone Encounter (Signed)
Review the patient's call regarding when she can get in the pool/bath; per Dr Berline Lopes six weeks

## 2019-11-26 ENCOUNTER — Telehealth: Payer: Self-pay | Admitting: *Deleted

## 2019-11-26 NOTE — Telephone Encounter (Signed)
Patient called and wanted to know [place] Per Melissa APP it's been six weeks, as long as the incisions are completely healed and have no glue she is good to go. Explained the above to the patient

## 2019-12-08 ENCOUNTER — Ambulatory Visit (HOSPITAL_BASED_OUTPATIENT_CLINIC_OR_DEPARTMENT_OTHER): Admit: 2019-12-08 | Payer: No Typology Code available for payment source | Admitting: Obstetrics and Gynecology

## 2019-12-08 ENCOUNTER — Encounter (HOSPITAL_BASED_OUTPATIENT_CLINIC_OR_DEPARTMENT_OTHER): Payer: Self-pay

## 2019-12-08 SURGERY — HYSTERECTOMY, ABDOMINAL, WITH SALPINGO-OOPHORECTOMY
Anesthesia: Choice | Laterality: Bilateral

## 2020-06-29 ENCOUNTER — Encounter: Payer: Self-pay | Admitting: Internal Medicine

## 2020-07-11 ENCOUNTER — Ambulatory Visit (AMBULATORY_SURGERY_CENTER): Payer: No Typology Code available for payment source | Admitting: *Deleted

## 2020-07-11 ENCOUNTER — Other Ambulatory Visit: Payer: Self-pay

## 2020-07-11 VITALS — Ht 65.5 in | Wt 135.0 lb

## 2020-07-11 DIAGNOSIS — Z01818 Encounter for other preprocedural examination: Secondary | ICD-10-CM

## 2020-07-11 DIAGNOSIS — Z1211 Encounter for screening for malignant neoplasm of colon: Secondary | ICD-10-CM

## 2020-07-11 NOTE — Progress Notes (Signed)
Pt verified name, DOB, address and insurance during PV today. Pt mailed instruction packet to included paper to complete and mail back to [facility] with addressed and stamped envelope, Emmi video, copy of consent form to read and not return, and instructions.PV completed over the phone. Pt encouraged to call with questions or issues   No egg or soy allergy known to patient  No issues with past sedation with any surgeries or procedures No intubation problems in the past  No FH of Malignant Hyperthermia No diet pills per patient No home 02 use per patient  No blood thinners per patient  Pt denies issues with constipation  No A fib or A flutter  EMMI video to pt or via MyChart  COVID 19 guidelines implemented in PV today with Pt and RN  Pt is not  vaccinated  for Covid -3-16 wed covid test at 830 am   Pt states missing tooth back molar was pulled - , denies dentures, partials, dental implants, capped or bonded teeth   Due to the COVID-19 pandemic we are asking patients to follow certain guidelines.  Pt aware of COVID protocols and [facility] guidelines

## 2020-07-25 ENCOUNTER — Telehealth: Payer: Self-pay | Admitting: Internal Medicine

## 2020-07-25 NOTE — Telephone Encounter (Signed)
New prep instructions mailed to pt and sent via mychart.

## 2020-08-15 ENCOUNTER — Encounter: Payer: Self-pay | Admitting: Internal Medicine

## 2020-08-29 ENCOUNTER — Other Ambulatory Visit: Payer: Self-pay | Admitting: Obstetrics and Gynecology

## 2020-08-29 DIAGNOSIS — Z1231 Encounter for screening mammogram for malignant neoplasm of breast: Secondary | ICD-10-CM

## 2020-09-22 ENCOUNTER — Encounter: Payer: Self-pay | Admitting: Gastroenterology

## 2020-09-26 ENCOUNTER — Encounter: Payer: Self-pay | Admitting: Internal Medicine

## 2020-10-31 ENCOUNTER — Ambulatory Visit: Payer: No Typology Code available for payment source

## 2020-10-31 ENCOUNTER — Ambulatory Visit: Payer: Self-pay | Admitting: Family Medicine

## 2020-11-24 ENCOUNTER — Ambulatory Visit (AMBULATORY_SURGERY_CENTER): Payer: No Typology Code available for payment source | Admitting: *Deleted

## 2020-11-24 ENCOUNTER — Other Ambulatory Visit: Payer: Self-pay

## 2020-11-24 VITALS — Ht 65.5 in | Wt 148.0 lb

## 2020-11-24 DIAGNOSIS — Z1211 Encounter for screening for malignant neoplasm of colon: Secondary | ICD-10-CM

## 2020-11-24 NOTE — Progress Notes (Signed)
Pt verified name, DOB, address and insurance during PV today. Pt mailed instruction packet to included paper to complete and mail back to [facility] with addressed and stamped envelope, Emmi video, copy of consent form to read and not return, and instructions. PV completed over the phone. Pt encouraged to call with questions or issues. My Chart instructions to pt as well    No egg or soy allergy known to patient  No issues with past sedation with any surgeries or procedures Patient denies ever being told they had issues or difficulty with intubation  No FH of Malignant Hyperthermia No diet pills per patient No home 02 use per patient  No blood thinners per patient  Pt denies issues with constipation  No A fib or A flutter  EMMI video to pt or via [place] 19 guidelines implemented in [place] today with Pt and RN  Pt is not vaccinated  for Covid 11-18-2020- today 6-30 no s/s on Abx and Steroid currently as well    Due to the COVID-19 pandemic we are asking patients to follow certain guidelines.  Pt aware of COVID protocols and [facility] guidelines

## 2020-12-05 ENCOUNTER — Ambulatory Visit
Admission: RE | Admit: 2020-12-05 | Discharge: 2020-12-05 | Disposition: A | Discharge status: Home / Self Care | Payer: No Typology Code available for payment source | Source: Ambulatory Visit | Attending: Obstetrics and Gynecology | Admitting: Obstetrics and Gynecology

## 2020-12-05 ENCOUNTER — Other Ambulatory Visit: Payer: Self-pay

## 2020-12-05 DIAGNOSIS — Z1231 Encounter for screening mammogram for malignant neoplasm of breast: Secondary | ICD-10-CM

## 2020-12-09 ENCOUNTER — Encounter: Payer: Self-pay | Admitting: Gastroenterology

## 2020-12-09 ENCOUNTER — Other Ambulatory Visit: Payer: Self-pay | Admitting: Obstetrics and Gynecology

## 2020-12-09 ENCOUNTER — Other Ambulatory Visit: Payer: Self-pay

## 2020-12-09 ENCOUNTER — Ambulatory Visit (AMBULATORY_SURGERY_CENTER): Payer: No Typology Code available for payment source | Admitting: Gastroenterology

## 2020-12-09 VITALS — BP 115/72 | HR 77 | Temp 97.7°F | Resp 13 | Ht 65.5 in | Wt 148.0 lb

## 2020-12-09 DIAGNOSIS — Z1211 Encounter for screening for malignant neoplasm of colon: Secondary | ICD-10-CM

## 2020-12-09 DIAGNOSIS — D120 Benign neoplasm of cecum: Secondary | ICD-10-CM

## 2020-12-09 DIAGNOSIS — R928 Other abnormal and inconclusive findings on diagnostic imaging of breast: Secondary | ICD-10-CM

## 2020-12-09 HISTORY — PX: COLONOSCOPY: SHX174

## 2020-12-09 MED ORDER — SODIUM CHLORIDE 0.9 % IV SOLN
500.0000 mL | Freq: Once | INTRAVENOUS | Status: DC
Start: 2020-12-09 — End: 2020-12-09

## 2020-12-09 NOTE — Progress Notes (Signed)
To pacu, VSS. Report to [email or web address] °

## 2020-12-09 NOTE — Progress Notes (Signed)
Pt's states no medical or surgical changes since previsit or office visit.  Vitals [place] 

## 2020-12-09 NOTE — Op Note (Signed)
[facility] Patient Name: Brandi Hill Procedure Date: 12/09/2020 1:53 PM MRN: [id number] Endoscopist: Milus Banister , MD Age: 50 Referring MD:  Date of Birth: Jan 12, 1971 Gender: Female Account #: [id number] Procedure:                Colonoscopy Indications:              Screening for colorectal malignant neoplasm Medicines:                Monitored Anesthesia Care Procedure:                Pre-Anesthesia Assessment:                           - Prior to the procedure, a History and Physical                            was performed, and patient medications and                            allergies were reviewed. The patient's tolerance of                            previous anesthesia was also reviewed. The risks                            and benefits of the procedure and the sedation                            options and risks were discussed with the patient.                            All questions were answered, and informed consent                            was obtained. Prior Anticoagulants: The patient has                            taken no previous anticoagulant or antiplatelet                            agents. ASA Grade Assessment: II - A patient with                            mild systemic disease. After reviewing the risks                            and benefits, the patient was deemed in                            satisfactory condition to undergo the procedure.                           After obtaining informed consent, the colonoscope  was passed under direct vision. Throughout the                            procedure, the patient's blood pressure, pulse, and                            oxygen saturations were monitored continuously. The                            CF [id number] #[id number] was introduced through the anus                            and advanced to the the cecum, identified by                            appendiceal  orifice and ileocecal valve. The                            colonoscopy was performed without difficulty. The                            patient tolerated the procedure well. The quality                            of the bowel preparation was good. The ileocecal                            valve, appendiceal orifice, and rectum were                            photographed. Scope In: 2:09:10 PM Scope Out: 2:22:27 PM Scope Withdrawal Time: 0 hours 7 minutes 43 seconds  Total Procedure Duration: 0 hours 13 minutes 17 seconds  Findings:                 A 2 mm polyp was found in the cecum. The polyp was                            sessile. The polyp was removed with a cold snare.                            Resection and retrieval were complete.                           The exam was otherwise without abnormality on                            direct and retroflexion views. Complications:            No immediate complications. Estimated blood loss:                            None. Estimated Blood Loss:     Estimated blood loss: none. Impression:               -  One 2 mm polyp in the cecum, removed with a cold                            snare. Resected and retrieved.                           - The examination was otherwise normal on direct                            and retroflexion views. Recommendation:           - Patient has a contact number available for                            emergencies. The signs and symptoms of potential                            delayed complications were discussed with the                            patient. Return to normal activities tomorrow.                            Written discharge instructions were provided to the                            patient.                           - Resume previous diet.                           - Continue present medications.                           - Await pathology results. Milus Banister, MD 12/09/2020 2:24:06  PM This report has been signed electronically.

## 2020-12-09 NOTE — Patient Instructions (Signed)
Handout given:  polyps Resume previous diet Continue current medications Await pathology results  YOU HAD AN ENDOSCOPIC PROCEDURE TODAY AT [place]:   Refer to the procedure report that was given to you for any specific questions about what was found during the examination.  If the procedure report does not answer your questions, please call your gastroenterologist to clarify.  If you requested that your care partner not be given the details of your procedure findings, then the procedure report has been included in a sealed envelope for you to review at your convenience later.  YOU SHOULD EXPECT: Some feelings of bloating in the abdomen. Passage of more gas than usual.  Walking can help get rid of the air that was put into your GI tract during the procedure and reduce the bloating. If you had a lower endoscopy (such as a colonoscopy or flexible sigmoidoscopy) you may notice spotting of blood in your stool or on the toilet paper. If you underwent a bowel prep for your procedure, you may not have a normal bowel movement for a few days.  Please Note:  You might notice some irritation and congestion in your nose or some drainage.  This is from the oxygen used during your procedure.  There is no need for concern and it should clear up in a day or so.  SYMPTOMS TO REPORT IMMEDIATELY:  Following lower endoscopy (colonoscopy or flexible sigmoidoscopy):  Excessive amounts of blood in the stool  Significant tenderness or worsening of abdominal pains  Swelling of the abdomen that is new, acute  Fever of 100F or higher For urgent or emergent issues, a gastroenterologist can be reached at any hour by calling [phone]. Do not use MyChart messaging for urgent concerns.   DIET:  We do recommend a small meal at first, but then you may proceed to your regular diet.  Drink plenty of fluids but you should avoid alcoholic beverages for 24 hours.  ACTIVITY:  You should plan to take it  easy for the rest of today and you should NOT DRIVE or use heavy machinery until tomorrow (because of the sedation medicines used during the test).    FOLLOW UP: Our staff will call the number listed on your records 48-72 hours following your procedure to check on you and address any questions or concerns that you may have regarding the information given to you following your procedure. If we do not reach you, we will leave a message.  We will attempt to reach you two times.  During this call, we will ask if you have developed any symptoms of COVID 19. If you develop any symptoms (ie: fever, flu-like symptoms, shortness of breath, cough etc.) before then, please call [phone].  If you test positive for Covid 19 in the 2 weeks post procedure, please call and report this information to [place].    If any biopsies were taken you will be contacted by phone or by letter within the next 1-3 weeks.  Please call us at [phone] if you have not heard about the biopsies in 3 weeks.   SIGNATURES/CONFIDENTIALITY: You and/or your care partner have signed paperwork which will be entered into your electronic medical record.  These signatures attest to the fact that that the information above on your After Visit Summary has been reviewed and is understood.  Full responsibility of the confidentiality of this discharge information lies with you and/or your care-partner.

## 2020-12-13 ENCOUNTER — Telehealth: Payer: Self-pay | Admitting: *Deleted

## 2020-12-13 NOTE — Telephone Encounter (Signed)
  Follow up Call-  Call back number 12/09/2020  Post procedure Call Back phone  # [phone]  Permission to leave phone message Yes  Some recent data might be hidden     Patient questions:  Do you have a fever, pain , or abdominal swelling? No. Pain Score  0 *  Have you tolerated food without any problems? Yes.    Have you been able to return to your normal activities? Yes.    Do you have any questions about your discharge instructions: Diet   No. Medications  No. Follow up visit  No.  Do you have questions or concerns about your Care? No.  Actions: * If pain score is 4 or above: No action needed, pain <4.  Pt states she did not receive a copy of her procedure report- mailed to pt.

## 2020-12-16 ENCOUNTER — Encounter: Payer: Self-pay | Admitting: Gastroenterology

## 2020-12-21 ENCOUNTER — Ambulatory Visit
Admission: RE | Admit: 2020-12-21 | Discharge: 2020-12-21 | Disposition: A | Discharge status: Home / Self Care | Payer: No Typology Code available for payment source | Source: Ambulatory Visit | Attending: Obstetrics and Gynecology | Admitting: Obstetrics and Gynecology

## 2020-12-21 ENCOUNTER — Other Ambulatory Visit: Payer: Self-pay

## 2020-12-21 DIAGNOSIS — R928 Other abnormal and inconclusive findings on diagnostic imaging of breast: Secondary | ICD-10-CM

## 2021-08-17 IMAGING — CT CT ABD-PELV W/ CM
2 of 5 series · 16 of 46 positions shown, 18 images · IV contrast (iopamidol)
Comparison: None.

CLINICAL DATA: Pelvic pain and bloating. Cystic pelvic mass seen on
outside ultrasound.

EXAM:
CT ABDOMEN AND PELVIS WITH CONTRAST
TECHNIQUE: Multidetector CT imaging of the abdomen and pelvis was performed
using the standard protocol following bolus administration of
intravenous contrast.
CONTRAST:  100mL SDPPQF-POO IOPAMIDOL (SDPPQF-POO) INJECTION 61%

[Series 2: abd pelvis 5.00 br40 s3 axial · axial · 0.67mm/px · z∈[+1186,+1556]mm · 13 of 84 slices shown, 15 images]
[im 5/84  soft-tissue]
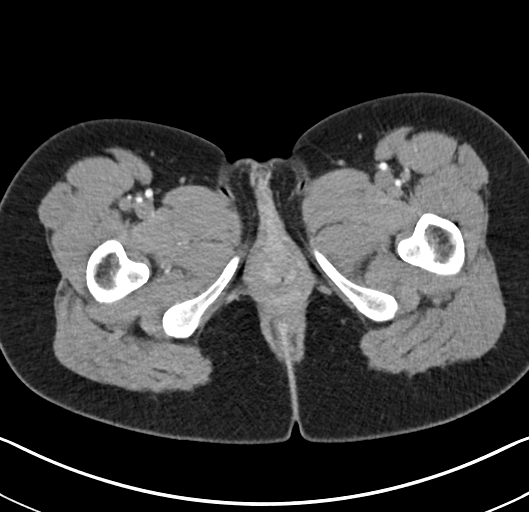
[im 5/84  bone]
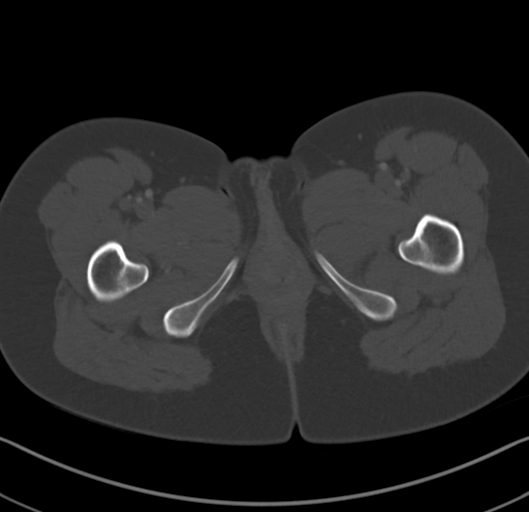
[im 10/84  soft-tissue]
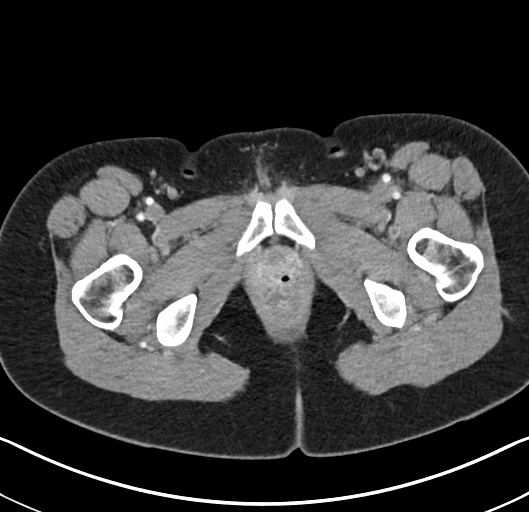
[im 20/84  soft-tissue]
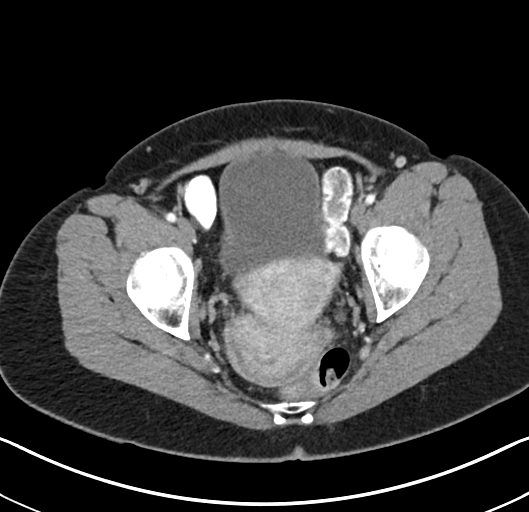
[im 25/84  soft-tissue]
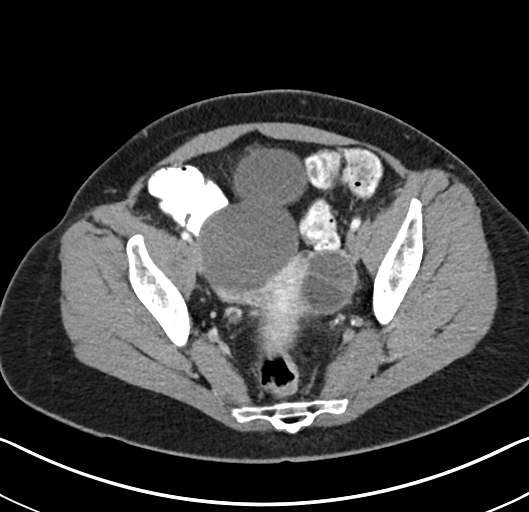
[im 30/84  soft-tissue]
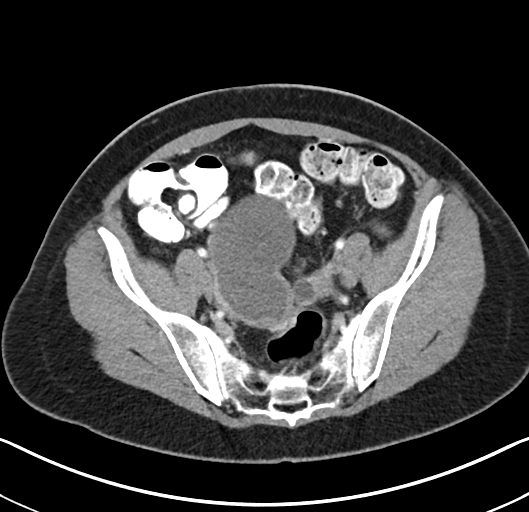
[im 35/84  soft-tissue]
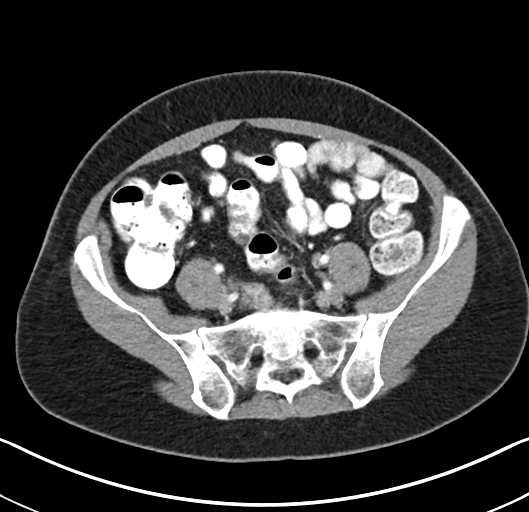
[im 44/84  soft-tissue]
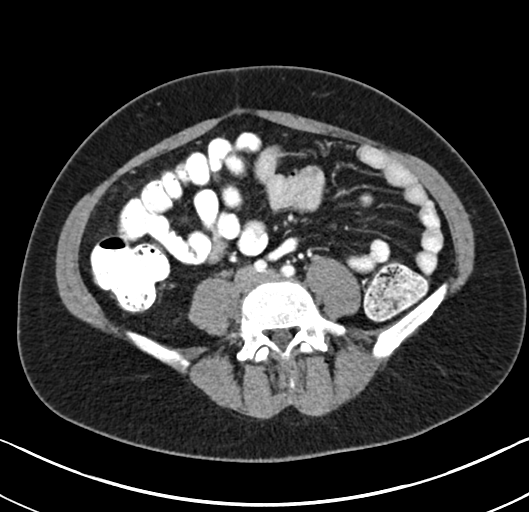
[im 49/84  soft-tissue]
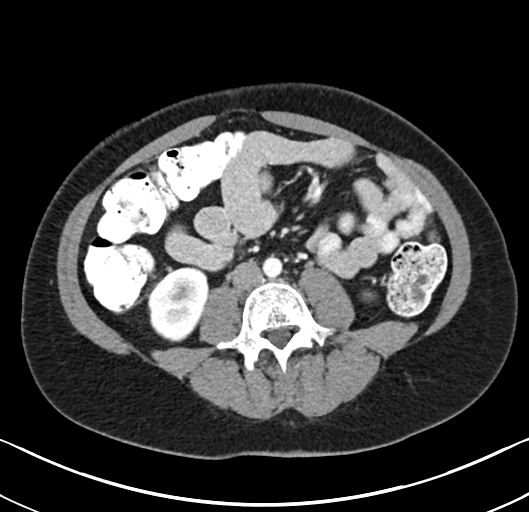
[im 54/84  soft-tissue]
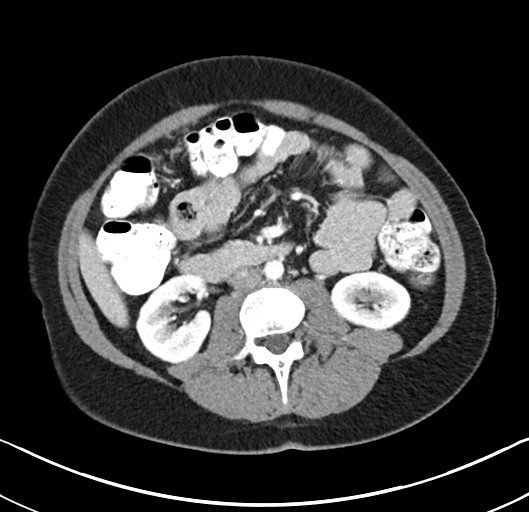
[im 54/84  bone]
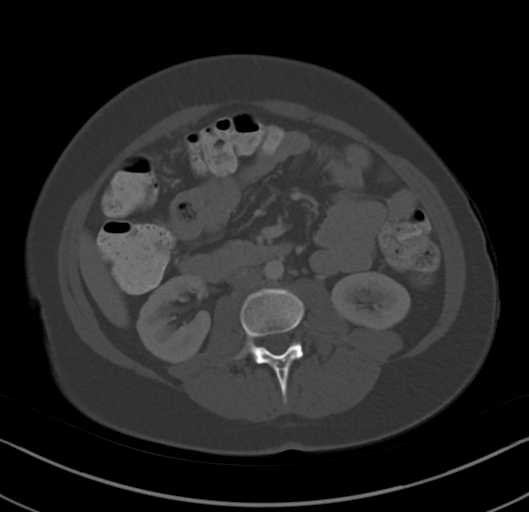
[im 59/84  soft-tissue]
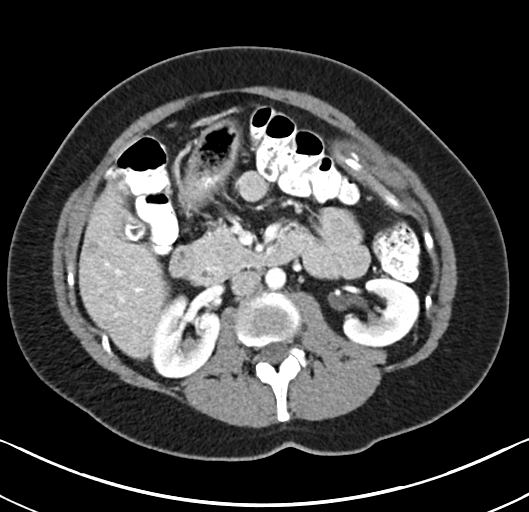
[im 64/84  soft-tissue]
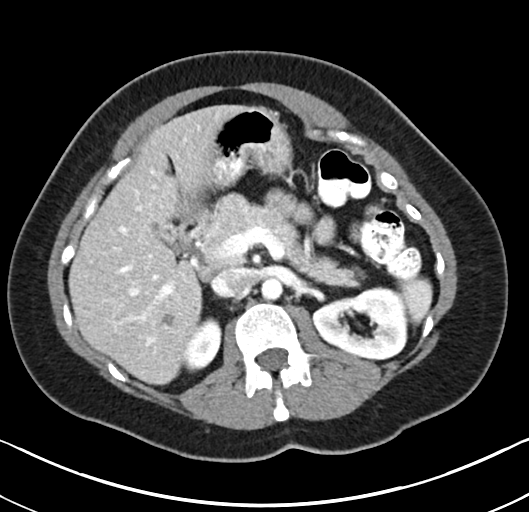
[im 74/84  soft-tissue]
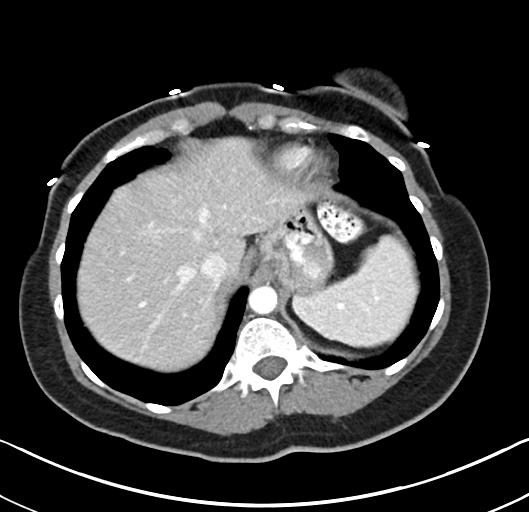
[im 79/84  soft-tissue]
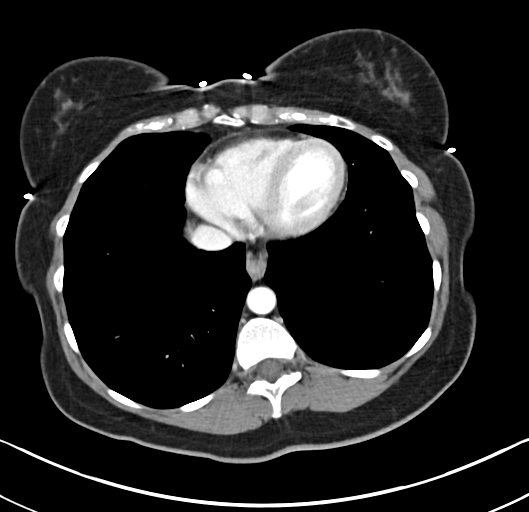

[Series 6: abd pelvis 2.00 br40 s3 cor · coronal · 0.70mm/px · 3 of 156 slices shown]
[im 52/156  soft-tissue]
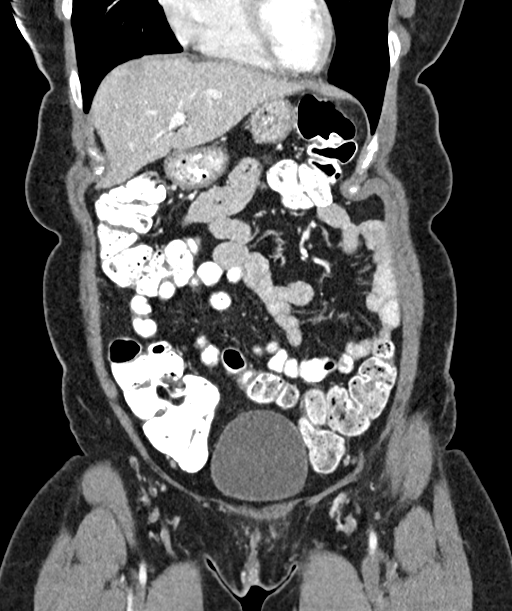
[im 69/156  soft-tissue]
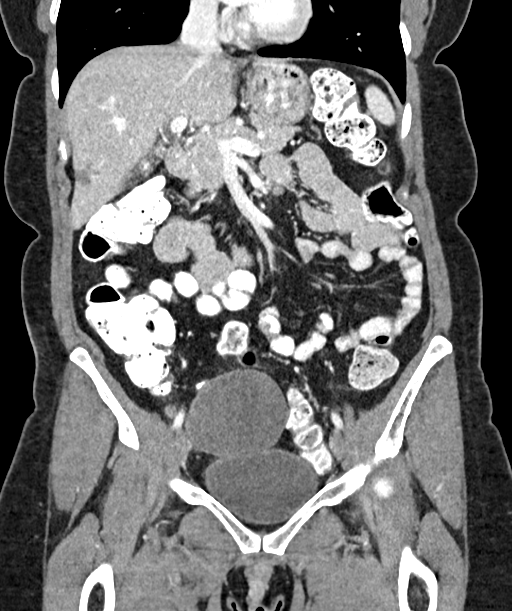
[im 87/156  soft-tissue]
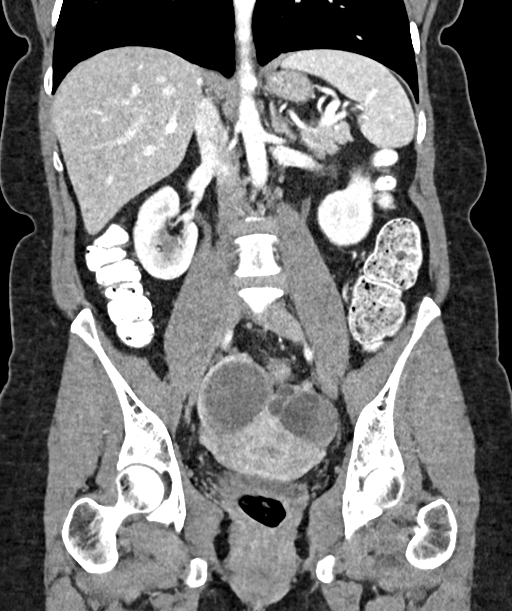

[16 of 46 positions shown; findings below may reference images not displayed]

FINDINGS: Lower Chest: No acute findings.

Hepatobiliary: A 1.9 cm benign hemangioma is seen in inferior right
hepatic lobe. Other smaller sub-cm low-attenuation lesions are seen
in the right lobe which are too small to characterize and are
indeterminate. Several calcified gallstones are seen, however there
is no evidence of cholecystitis or biliary ductal dilatation.

Pancreas:  No mass or inflammatory changes.

Spleen: Within normal limits in size and appearance.

Adrenals/Urinary Tract: No masses identified. No evidence of
ureteral calculi or hydronephrosis.

Stomach/Bowel: No evidence of obstruction, inflammatory process or
abnormal fluid collections.

Vascular/Lymphatic: No pathologically enlarged lymph nodes. No
abdominal aortic aneurysm.

Reproductive: Uterus is unremarkable in appearance. An ovoid cystic
lesion is seen in the right adnexa which shows mild peripheral wall
thickening and measures 8.9 x 6.0 cm on image 56/2. A complex cystic
lesion containing multiple thin internal septations is seen in the
left adnexa which measures 5.4 x 4.0 cm.

Other:  No evidence peritoneal thickening, nodularity, or ascites.

Musculoskeletal:  No suspicious bone lesions identified.
IMPRESSION: 1. Bilateral complex cystic adnexal lesions measuring 5.4 cm on the
left and 8.9 cm on the right, suspicious for cystic ovarian
neoplasms. Recommend correlation with tumor markers, and consider
surgical evaluation.
2. 1.9 cm benign hemangioma in inferior right hepatic lobe.
3. Several tiny indeterminate right hepatic lobe lesions. Consider
further evaluation with abdomen MRI without and with contrast, or
continued follow-up by CT in 3-6 months.
4.  Cholelithiasis.  No radiographic evidence of cholecystitis.

## 2021-11-22 ENCOUNTER — Other Ambulatory Visit: Payer: Self-pay | Admitting: Family Medicine

## 2021-11-22 DIAGNOSIS — Z1231 Encounter for screening mammogram for malignant neoplasm of breast: Secondary | ICD-10-CM

## 2022-01-15 ENCOUNTER — Ambulatory Visit
Admission: RE | Admit: 2022-01-15 | Discharge: 2022-01-15 | Disposition: A | Discharge status: Home / Self Care | Payer: BC Managed Care – PPO | Source: Ambulatory Visit | Attending: Family Medicine | Admitting: Family Medicine

## 2022-01-15 DIAGNOSIS — Z1231 Encounter for screening mammogram for malignant neoplasm of breast: Secondary | ICD-10-CM

## 2022-12-20 ENCOUNTER — Other Ambulatory Visit: Payer: Self-pay | Admitting: Obstetrics and Gynecology

## 2022-12-20 DIAGNOSIS — Z0000 Encounter for general adult medical examination without abnormal findings: Secondary | ICD-10-CM

## 2023-01-18 ENCOUNTER — Ambulatory Visit
Admission: RE | Admit: 2023-01-18 | Discharge: 2023-01-18 | Disposition: A | Discharge status: Home / Self Care | Payer: Commercial Managed Care - HMO | Source: Ambulatory Visit | Attending: Obstetrics and Gynecology | Admitting: Obstetrics and Gynecology

## 2023-01-18 DIAGNOSIS — Z0000 Encounter for general adult medical examination without abnormal findings: Secondary | ICD-10-CM

## 2023-07-15 ENCOUNTER — Ambulatory Visit: Payer: Commercial Managed Care - HMO | Admitting: Family Medicine

## 2024-01-13 ENCOUNTER — Other Ambulatory Visit: Payer: Self-pay | Admitting: Obstetrics and Gynecology

## 2024-01-13 DIAGNOSIS — Z1231 Encounter for screening mammogram for malignant neoplasm of breast: Secondary | ICD-10-CM

## 2024-03-02 ENCOUNTER — Ambulatory Visit

## 2024-03-16 ENCOUNTER — Ambulatory Visit
Admission: RE | Admit: 2024-03-16 | Discharge: 2024-03-16 | Disposition: A | Discharge status: Home / Self Care | Payer: Self-pay | Source: Ambulatory Visit | Attending: Obstetrics and Gynecology | Admitting: Obstetrics and Gynecology

## 2024-03-16 DIAGNOSIS — Z1231 Encounter for screening mammogram for malignant neoplasm of breast: Secondary | ICD-10-CM
